# Patient Record
Sex: Female | Born: 1935
Health system: Southern US, Community
[De-identification: ages and names within clinical notes are randomized; demographics above are authoritative.]

## PROBLEM LIST (undated history)

## (undated) DIAGNOSIS — E119 Type 2 diabetes mellitus without complications: Secondary | ICD-10-CM

## (undated) DIAGNOSIS — E782 Mixed hyperlipidemia: Secondary | ICD-10-CM

## (undated) DIAGNOSIS — I10 Essential (primary) hypertension: Secondary | ICD-10-CM

## (undated) DIAGNOSIS — E7800 Pure hypercholesterolemia, unspecified: Secondary | ICD-10-CM

## (undated) DIAGNOSIS — I429 Cardiomyopathy, unspecified: Secondary | ICD-10-CM

## (undated) HISTORY — DX: Type 2 diabetes mellitus without complications: E11.9

## (undated) HISTORY — DX: Pure hypercholesterolemia, unspecified: E78.00

## (undated) HISTORY — PX: OTHER SURGICAL HISTORY: SHX169

## (undated) HISTORY — PX: EYE SURGERY: SHX253

## (undated) HISTORY — DX: Essential (primary) hypertension: I10

---
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
## 2000-12-03 ENCOUNTER — Ambulatory Visit (HOSPITAL_COMMUNITY): Admission: RE | Admit: 2000-12-03 | Discharge: 2000-12-03 | Payer: Self-pay | Admitting: Internal Medicine

## 2000-12-18 ENCOUNTER — Ambulatory Visit (HOSPITAL_COMMUNITY): Admission: RE | Admit: 2000-12-18 | Discharge: 2000-12-18 | Payer: Self-pay | Admitting: Cardiovascular Disease

## 2001-12-23 ENCOUNTER — Ambulatory Visit (HOSPITAL_COMMUNITY): Admission: RE | Admit: 2001-12-23 | Discharge: 2001-12-23 | Payer: Self-pay | Admitting: Family Medicine

## 2001-12-23 ENCOUNTER — Encounter: Payer: Self-pay | Admitting: Family Medicine

## 2002-03-05 ENCOUNTER — Ambulatory Visit (HOSPITAL_COMMUNITY): Admission: RE | Admit: 2002-03-05 | Discharge: 2002-03-05 | Payer: Self-pay | Admitting: Family Medicine

## 2002-03-05 ENCOUNTER — Encounter: Payer: Self-pay | Admitting: Family Medicine

## 2003-05-14 ENCOUNTER — Ambulatory Visit (HOSPITAL_COMMUNITY): Admission: RE | Admit: 2003-05-14 | Discharge: 2003-05-14 | Payer: Self-pay | Admitting: Family Medicine

## 2003-05-14 ENCOUNTER — Encounter: Payer: Self-pay | Admitting: Family Medicine

## 2003-05-26 ENCOUNTER — Encounter (HOSPITAL_COMMUNITY): Admission: RE | Admit: 2003-05-26 | Discharge: 2003-08-24 | Payer: Self-pay | Admitting: Family Medicine

## 2007-10-24 ENCOUNTER — Ambulatory Visit (HOSPITAL_COMMUNITY): Admission: RE | Admit: 2007-10-24 | Discharge: 2007-10-24 | Payer: Self-pay | Admitting: Family Medicine

## 2008-05-01 ENCOUNTER — Ambulatory Visit (HOSPITAL_COMMUNITY): Admission: RE | Admit: 2008-05-01 | Discharge: 2008-05-01 | Payer: Self-pay | Admitting: Family Medicine

## 2010-05-30 ENCOUNTER — Encounter (INDEPENDENT_AMBULATORY_CARE_PROVIDER_SITE_OTHER): Payer: Self-pay

## 2010-08-30 NOTE — Letter (Signed)
Summary: Recall, Screening Colonoscopy Only  [facility]  [address]   [place], [place] [address]   Phone: [phone]  Fax: [phone]    May 30, 2010  Caitlin Reed [address] [place], [place]  [address] 04/06/1936   Dear [name],   Our records indicate it is time to schedule your colonoscopy.    Please call our office at [phone] and ask for the nurse.   Thank you,  [name], LPN [name], LPN  [facility] Ph: [phone]   Fax: [phone]

## 2010-11-15 ENCOUNTER — Other Ambulatory Visit (HOSPITAL_COMMUNITY): Payer: Self-pay | Admitting: Family Medicine

## 2010-11-15 DIAGNOSIS — Z139 Encounter for screening, unspecified: Secondary | ICD-10-CM

## 2010-11-23 ENCOUNTER — Ambulatory Visit (HOSPITAL_COMMUNITY)
Admission: RE | Admit: 2010-11-23 | Discharge: 2010-11-23 | Disposition: A | Discharge status: Home / Self Care | Payer: Medicare Other | Source: Ambulatory Visit | Attending: Family Medicine | Admitting: Family Medicine

## 2010-11-23 DIAGNOSIS — Z139 Encounter for screening, unspecified: Secondary | ICD-10-CM

## 2010-11-23 DIAGNOSIS — Z13820 Encounter for screening for osteoporosis: Secondary | ICD-10-CM | POA: Insufficient documentation

## 2010-12-16 NOTE — Cardiovascular Report (Signed)
[place]  Patient:    Caitlin Reed, Caitlin Reed                  MRN: [id number] Proc. Date: 12/18/00 Adm. Date:  [id number] Disc. Date: [id number] Attending:  Berry, Jonathan [place] CC:         Cardiac Catheterization Laboratory             John Giovanni, M.D., [place], [place]             [facility]             [address] [place], [place]  [address]                        Cardiac Catheterization  INDICATIONS:  [name] is a 75 year old, mildly overweight, married white female with history of hypertension, hyperlipidemia, and type 2 diabetes.  She had a positive GXT and a Cardiolite that showed subtle anterior ischemia.  She presents now for diagnostic coronary arteriography.  PROCEDURE DESCRIPTION:  The patient is brought to the second floor [facility] Cardiac Catheterization Laboratory in the postabsorptive state.  She is premedicated with p.o. Valium.  Her right groin is prepped and shaved in the usual sterile fashion.  One percent Xylocaine was used for local anesthesia.  A 6-French sheath was inserted into the right femoral artery using the standard Seldinger technique.  Six French right and left Judkins diagnostic catheters, along with a 6-French pigtail catheter were used for selective coronary angiography, left ventriculography, and distal abdominal aortography. Omnipaque dye was used for the entirety of the case.  Retrograde aortic, left ventricular, and pullback pressures were recorded.  HEMODYNAMICS: 1. Aortic systolic pressure 163, diastolic pressure 70. 2. Left ventricular systolic pressure 164, end-diastolic pressure 21.  SELECTIVE CORONARY ANGIOGRAPHY: 1. Left main:  Normal. 2. Left anterior descending coronary artery:  Normal. 3. Left circumflex:  Dominant and anomalous:  Normal. 4. Right coronary artery:  Nondominant and normal.  LEFT VENTRICULOGRAPHY:  RAO left ventriculogram was  performed using 25 cc of Omnipaque dye at 12 cc/sec.  The overall LV-EF was estimated at greater than 60%, without focal wall motion abnormalities.  DISTAL ABDOMINAL AORTOGRAPHY:  Distal abdominal aortogram was performed using 20 cc of Omnipaque dye at 20 cc/sec.  The renal arteries were widely patent. The infrarenal abdominal aorta and iliac bifurcation were free of significant atherosclerotic changes.  IMPRESSION:  [name] has normal coronary arteries and normal left ventricular function.  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  She had a false positive Cardiolite and noncardiac chest pain.  DISPOSITION:  The sheaths were removed and pressure was held on the groin to achieve hemostasis.  The patient left the laboratory in stable condition.  She will be discharged later today as an outpatient.  She will see me back in the office in two weeks in follow-up.  Dr. John Giovanni was notified of these results. DD:  12/18/00 TD:  12/18/00 Job: [id number]

## 2013-09-11 ENCOUNTER — Encounter (INDEPENDENT_AMBULATORY_CARE_PROVIDER_SITE_OTHER): Payer: Self-pay | Admitting: *Deleted

## 2013-09-22 ENCOUNTER — Telehealth (INDEPENDENT_AMBULATORY_CARE_PROVIDER_SITE_OTHER): Payer: Self-pay | Admitting: *Deleted

## 2013-09-22 ENCOUNTER — Encounter (INDEPENDENT_AMBULATORY_CARE_PROVIDER_SITE_OTHER): Payer: Self-pay | Admitting: *Deleted

## 2013-09-22 ENCOUNTER — Other Ambulatory Visit (INDEPENDENT_AMBULATORY_CARE_PROVIDER_SITE_OTHER): Payer: Self-pay | Admitting: *Deleted

## 2013-09-22 DIAGNOSIS — Z1211 Encounter for screening for malignant neoplasm of colon: Secondary | ICD-10-CM

## 2013-09-22 MED ORDER — PEG-KCL-NACL-NASULF-NA ASC-C 100 G PO SOLR: 1.0000 | Freq: Once | ORAL | Status: DC

## 2013-09-22 NOTE — Telephone Encounter (Signed)
Patient needs movi prep 

## 2013-10-13 ENCOUNTER — Encounter (INDEPENDENT_AMBULATORY_CARE_PROVIDER_SITE_OTHER): Payer: Self-pay | Admitting: *Deleted

## 2013-10-28 ENCOUNTER — Telehealth (INDEPENDENT_AMBULATORY_CARE_PROVIDER_SITE_OTHER): Payer: Self-pay | Admitting: *Deleted

## 2013-10-28 NOTE — Telephone Encounter (Signed)
  Procedure: tcs  Reason/Indication:  screening  Has patient had this procedure before?  Yes, 12-13 years ago  If so, when, by whom and where?    Is there a family history of colon cancer?  no  Who?  What age when diagnosed?    Is patient diabetic?   yes      Does patient have prosthetic heart valve?  no  Do you have a pacemaker?  no  Has patient ever had endocarditis? no  Has patient had joint replacement within last 12 months?  no  Does patient tend to be constipated or take laxatives? no  Is patient on Coumadin, Plavix and/or Aspirin? yes  Medications: asa 81 mg daily, metformin 1000 mg bid (am & pm), diltiazem 60 mg 1 tab in am 1/2 tab in pm, hctz 25 mg daily, pravastatin 40 mg daily, furosemide 40 mg 1/2 tab daily, preservision bid, mulit vit daily, calcium 1200 mg daily, vit d3 1000 iu daily, prilosec daily  Allergies: nkda  Medication Adjustment: asa 2 days, hold evening dose of metformin day before and morning of  Procedure date & time: 11/26/13

## 2013-10-28 NOTE — Telephone Encounter (Signed)
agree

## 2013-11-12 ENCOUNTER — Other Ambulatory Visit (INDEPENDENT_AMBULATORY_CARE_PROVIDER_SITE_OTHER): Payer: Self-pay | Admitting: *Deleted

## 2013-11-12 ENCOUNTER — Encounter (HOSPITAL_COMMUNITY): Payer: Self-pay | Admitting: Pharmacy Technician

## 2013-11-12 ENCOUNTER — Encounter (INDEPENDENT_AMBULATORY_CARE_PROVIDER_SITE_OTHER): Payer: Self-pay | Admitting: *Deleted

## 2013-11-12 ENCOUNTER — Ambulatory Visit (INDEPENDENT_AMBULATORY_CARE_PROVIDER_SITE_OTHER): Payer: Medicare Other | Admitting: Internal Medicine

## 2013-11-12 ENCOUNTER — Encounter (INDEPENDENT_AMBULATORY_CARE_PROVIDER_SITE_OTHER): Payer: Self-pay | Admitting: Internal Medicine

## 2013-11-12 VITALS — BP 128/52 | HR 80 | Temp 98.0°F | Ht 63.0 in | Wt 175.3 lb

## 2013-11-12 DIAGNOSIS — D509 Iron deficiency anemia, unspecified: Secondary | ICD-10-CM

## 2013-11-12 NOTE — Patient Instructions (Signed)
EGD. The risks and benefits such as perforation, bleeding, and infection were reviewed with the patient and is agreeable. 

## 2013-11-12 NOTE — Progress Notes (Signed)
Subjective:     Patient ID: Caitlin Reed, female   DOB: 11/19/1935, 78 y.o.   MRN: [id number]  HPI Referred to our office by Dr. Karie Kirks for an EGD for Iron deficiency anemia.  Presently scheduled for a colonoscopy the end of this month. 11/05/2013 H and H 11.2 and 34.5, MCV 85.0. Labs in December revealed an H and H 10.2 and 32.2, MCV 84.6. Iron 38, %Sat 9.  Ferritin 7. Her last colonoscopy was in 2002 and was normal.  Appetite is good. No weight loss. No acid reflux which is controlled with Omeprazole. No dysphagia.  There is no abdominal pain. She usually has a BM daily. No melena or bright red rectal bleeding. Stool negative for blood at Dr. Vickey Sages office.   Review of Systems     Past Medical History  Diagnosis Date  . Hypertension   . Diabetes     x 24 yrs  . High cholesterol     Past Surgical History  Procedure Laterality Date  . None      No Known Allergies  Current Outpatient Prescriptions on File Prior to Visit  Medication Sig Dispense Refill  . peg 3350 powder (MOVIPREP) 100 G SOLR Take 1 kit (200 g total) by mouth once.  1 kit  0   No current facility-administered medications on file prior to visit.     Objective:   Physical Exam  Filed Vitals:   11/12/13 1001  BP: 128/52  Pulse: 80  Temp: 98 F (36.7 C)  Height: 5' 3" (1.6 m)  Weight: 175 lb 4.8 oz (79.516 kg)   Alert and oriented. Skin warm and dry. Oral mucosa is moist.   . Sclera anicteric, conjunctivae is pink. Thyroid not enlarged. No cervical lymphadenopathy. Lungs clear. Heart regular rate and rhythm.  Abdomen is soft. Bowel sounds are positive. No hepatomegaly. No abdominal masses felt. No tenderness.  No edema to lower extremities.      Assessment:     Iron deficiency anemia. PUD needs to be ruled out. However no GERD at this time. Stool was negative for blood at Dr. Vickey Sages office. I discussed this case with Dr. Laural Golden    Plan:       EGD with Dr.Rehman.The risks and benefits  such as perforation, bleeding, and infection were reviewed with the patient and is agreeable.

## 2013-11-13 DIAGNOSIS — E7800 Pure hypercholesterolemia, unspecified: Secondary | ICD-10-CM | POA: Insufficient documentation

## 2013-11-13 DIAGNOSIS — D509 Iron deficiency anemia, unspecified: Secondary | ICD-10-CM | POA: Insufficient documentation

## 2013-11-13 DIAGNOSIS — E119 Type 2 diabetes mellitus without complications: Secondary | ICD-10-CM | POA: Insufficient documentation

## 2013-11-14 ENCOUNTER — Ambulatory Visit: Admit: 2013-11-14 | Payer: Self-pay | Admitting: Internal Medicine

## 2013-11-14 SURGERY — EGD (ESOPHAGOGASTRODUODENOSCOPY)
Anesthesia: Moderate Sedation

## 2013-11-26 ENCOUNTER — Encounter (HOSPITAL_COMMUNITY)
Admission: RE | Disposition: A | Discharge status: Home / Self Care | Payer: Self-pay | Source: Ambulatory Visit | Attending: Internal Medicine

## 2013-11-26 ENCOUNTER — Ambulatory Visit (HOSPITAL_COMMUNITY): Admission: RE | Admit: 2013-11-26 | Payer: Medicare Other | Source: Ambulatory Visit | Admitting: Internal Medicine

## 2013-11-26 ENCOUNTER — Encounter (HOSPITAL_COMMUNITY): Admission: RE | Payer: Self-pay | Source: Ambulatory Visit

## 2013-11-26 ENCOUNTER — Ambulatory Visit (HOSPITAL_COMMUNITY)
Admission: RE | Admit: 2013-11-26 | Discharge: 2013-11-26 | Disposition: A | Discharge status: Home / Self Care | Payer: Medicare Other | Source: Ambulatory Visit | Attending: Internal Medicine | Admitting: Internal Medicine

## 2013-11-26 ENCOUNTER — Encounter (HOSPITAL_COMMUNITY): Payer: Self-pay | Admitting: *Deleted

## 2013-11-26 DIAGNOSIS — D509 Iron deficiency anemia, unspecified: Secondary | ICD-10-CM

## 2013-11-26 DIAGNOSIS — E119 Type 2 diabetes mellitus without complications: Secondary | ICD-10-CM | POA: Insufficient documentation

## 2013-11-26 DIAGNOSIS — K644 Residual hemorrhoidal skin tags: Secondary | ICD-10-CM | POA: Insufficient documentation

## 2013-11-26 DIAGNOSIS — Z7982 Long term (current) use of aspirin: Secondary | ICD-10-CM | POA: Insufficient documentation

## 2013-11-26 DIAGNOSIS — K5730 Diverticulosis of large intestine without perforation or abscess without bleeding: Secondary | ICD-10-CM | POA: Insufficient documentation

## 2013-11-26 DIAGNOSIS — K219 Gastro-esophageal reflux disease without esophagitis: Secondary | ICD-10-CM | POA: Insufficient documentation

## 2013-11-26 DIAGNOSIS — D129 Benign neoplasm of anus and anal canal: Principal | ICD-10-CM

## 2013-11-26 DIAGNOSIS — D128 Benign neoplasm of rectum: Secondary | ICD-10-CM | POA: Insufficient documentation

## 2013-11-26 DIAGNOSIS — D126 Benign neoplasm of colon, unspecified: Secondary | ICD-10-CM

## 2013-11-26 DIAGNOSIS — Z79899 Other long term (current) drug therapy: Secondary | ICD-10-CM | POA: Insufficient documentation

## 2013-11-26 DIAGNOSIS — I10 Essential (primary) hypertension: Secondary | ICD-10-CM | POA: Insufficient documentation

## 2013-11-26 HISTORY — PX: ESOPHAGOGASTRODUODENOSCOPY: SHX5428

## 2013-11-26 HISTORY — PX: COLONOSCOPY: SHX5424

## 2013-11-26 LAB — GLUCOSE, CAPILLARY: GLUCOSE-CAPILLARY: 145 mg/dL — AB (ref 70–99)

## 2013-11-26 SURGERY — COLONOSCOPY
Anesthesia: Moderate Sedation

## 2013-11-26 MED ORDER — MIDAZOLAM HCL 5 MG/5ML IJ SOLN
INTRAMUSCULAR | Status: AC
  Filled 2013-11-26: qty 10

## 2013-11-26 MED ORDER — SODIUM CHLORIDE 0.9 % IV SOLN
INTRAVENOUS | Status: DC
  Administered 2013-11-26: 09:00:00 via INTRAVENOUS

## 2013-11-26 MED ORDER — BUTAMBEN-TETRACAINE-BENZOCAINE 2-2-14 % EX AERO
INHALATION_SPRAY | CUTANEOUS | Status: DC | PRN
  Administered 2013-11-26: 2 via TOPICAL

## 2013-11-26 MED ORDER — STERILE WATER FOR IRRIGATION IR SOLN
Status: DC | PRN
  Administered 2013-11-26: 09:00:00

## 2013-11-26 MED ORDER — MIDAZOLAM HCL 5 MG/5ML IJ SOLN
INTRAMUSCULAR | Status: DC | PRN
  Administered 2013-11-26: 2 mg via INTRAVENOUS
  Administered 2013-11-26: 1 mg via INTRAVENOUS
  Administered 2013-11-26 (×2): 2 mg via INTRAVENOUS
  Administered 2013-11-26: 1 mg via INTRAVENOUS

## 2013-11-26 MED ORDER — MEPERIDINE HCL 50 MG/ML IJ SOLN
INTRAMUSCULAR | Status: AC
  Filled 2013-11-26: qty 1

## 2013-11-26 MED ORDER — MEPERIDINE HCL 50 MG/ML IJ SOLN
INTRAMUSCULAR | Status: DC | PRN
  Administered 2013-11-26 (×2): 25 mg via INTRAVENOUS

## 2013-11-26 MED ORDER — FERROUS SULFATE 325 (65 FE) MG PO TABS: 325.0000 mg | ORAL_TABLET | Freq: Every day | ORAL | Status: AC

## 2013-11-26 NOTE — H&P (Signed)
Caitlin Reed is an 78 y.o. female.   Chief Complaint: Patient's here for EGD and colonoscopy. HPI: Patient is 75 old Caucasian female was found to have anemia back in December 2014 and workup revealed iron deficiency. Her hemoglobin then was 10.2. S., to 11.2 with oral iron therapy. She denies melena or rectal bleeding vaginal bleeding or hematuria. Her stool was guaiac negative. She is on PPI for chronic GERD. She is on low dose aspirin but does not take other OTC NSAIDs. Family history is  negative for CRC or celiac disease.  Past Medical History  Diagnosis Date  . Hypertension   . Diabetes     x 24 yrs  . High cholesterol     Past Surgical History  Procedure Laterality Date  . None      History reviewed. No pertinent family history. Social History:  reports that she has never smoked. She does not have any smokeless tobacco history on file. She reports that she does not drink alcohol or use illicit drugs.  Allergies: No Known Allergies  Medications Prior to Admission  Medication Sig Dispense Refill  . aspirin 81 MG tablet Take 81 mg by mouth daily.      . calcium carbonate (OS-CAL) 600 MG TABS tablet Take 1,200 mg by mouth daily.       . cholecalciferol (VITAMIN D) 400 UNITS TABS tablet Take 1,000 Units by mouth.      . diltiazem (CARDIZEM) 60 MG tablet Take 60 mg by mouth 2 (two) times daily.      . furosemide (LASIX) 20 MG tablet Take 20 mg by mouth as needed for fluid.       . hydrochlorothiazide (HYDRODIURIL) 25 MG tablet Take 25 mg by mouth daily.      [name] LORazepam (ATIVAN) 0.5 MG tablet Take 0.5 mg by mouth daily as needed for anxiety.       . metFORMIN (GLUCOPHAGE) 1000 MG tablet Take 1,000 mg by mouth.      . Multiple Vitamins-Minerals (ALIVE WOMENS ENERGY PO) Take by mouth.      . Multiple Vitamins-Minerals (PRESERVISION AREDS 2 PO) Take by mouth.      [name] omeprazole (PRILOSEC) 20 MG capsule Take 20 mg by mouth daily.      . pravastatin (PRAVACHOL) 40 MG tablet Take  40 mg by mouth daily.      . peg 3350 powder (MOVIPREP) 100 G SOLR Take 1 kit (200 g total) by mouth once.  1 kit  0    Results for orders placed during the hospital encounter of 11/26/13 (from the past 48 hour(s))  GLUCOSE, CAPILLARY     Status: Abnormal   Collection Time    11/26/13  8:36 AM      Result Value Ref Range   Glucose-Capillary 145 (*) 70 - 99 mg/dL   No results found.  ROS  Blood pressure 154/67, pulse 79, temperature 97.8 F (36.6 C), temperature source Oral, resp. rate 20, height _0  (1.6 m), weight 171 lb (77.565 kg), SpO2 99.00%. Physical Exam  Constitutional: She appears well-developed and well-nourished.  HENT:  Mouth/Throat: Oropharynx is clear and moist.  Eyes: Conjunctivae are normal. No scleral icterus.  Neck: No thyromegaly present.  Cardiovascular: Normal rate and regular rhythm.   No murmur heard. Respiratory: Effort normal and breath sounds normal.  GI: Soft. She exhibits no distension and no mass. There is no tenderness.  Musculoskeletal: She exhibits no edema.  Lymphadenopathy:    She has no  cervical adenopathy.  Neurological: She is alert.  Skin: Skin is warm and dry.     Assessment/Plan Chronic GERD. Iron deficiency anemia. EGD and Colonoscopy.   Rogene Houston 11/26/2013, 9:31 AM

## 2013-11-26 NOTE — Op Note (Signed)
EGD AND COLONOSCOPY PROCEDURE REPORT  PATIENT:  Caitlin Reed  MR#:  [id number] Birthdate:  26-Dec-1935, 78 y.o., female Endoscopist:  Dr. Rogene Houston, MD Referred By:  Dr. Estill Bamberg. Karie Kirks, MD  Procedure Date: 11/26/2013  Procedure:   EGD & Colonoscopy  Indications:  Patient is 39 old Caucasian female who was recently found to have iron deficiency anemia. Her stool is guaiac-negative. She denies melena or rectal bleeding vaginal bleeding or hematuria. Her last colonoscopy was in 2002. She has chronic GERD and is on PPI. She does not take NSAIDs other than low-dose aspirin. Family history is negative for CRC.            Informed Consent:  The risks, benefits, alternatives & imponderables which include, but are not limited to, bleeding, infection, perforation, drug reaction and potential missed lesion have been reviewed.  The potential for biopsy, lesion removal, esophageal dilation, etc. have also been discussed.  Questions have been answered.  All parties agreeable.  Please see history & physical in medical record for more information.  Medications:  Demerol 50 mg IV Versed 8 mg IV Cetacaine spray topically for oropharyngeal anesthesia  EGD  Description of procedure:  The endoscope was introduced through the mouth and advanced to the second portion of the duodenum without difficulty or limitations. The mucosal surfaces were surveyed very carefully during advancement of the scope and upon withdrawal.  Findings:  Esophagus:  Normal mucosa of the esophagus. GE junction was unremarkable. GEJ:  36 cm Stomach:  Stomach was empty and distended very well with insufflation. Folds in the proximal stomach were normal. Examination of mucosa at gastric body, antrum, pyloric channel, angularis, fundus and cardia was normal. Duodenum:  Normal bulbar and post bulbar mucosa.  Therapeutic/Diagnostic Maneuvers Performed:  None  COLONOSCOPY Description of procedure:  After a digital rectal  exam was performed, that colonoscope was advanced from the anus through the rectum and colon to the area of the cecum, ileocecal valve and appendiceal orifice. The cecum was deeply intubated. These structures were well-seen and photographed for the record. From the level of the cecum and ileocecal valve, the scope was slowly and cautiously withdrawn. The mucosal surfaces were carefully surveyed utilizing scope tip to flexion to facilitate fold flattening as needed. The scope was pulled down into the rectum where a thorough exam including retroflexion was performed.  Findings:   Prep excellent. Few scattered small diverticula at descending and sigmoid colon. 4 mm polyp cold snared from rectosigmoid junction. Small hemorrhoids below the dentate line.  Therapeutic/Diagnostic Maneuvers Performed:  See above  Complications:  None  Cecal Withdrawal Time:  11 minutes  Impression:  Normal esophagogastroduodenoscopy. Few scattered diverticula at the descending and sigmoid colon. Small polyp cold snare from rectosigmoid junction. Small external hemorrhoids.  Comment; Iron deficiency anemia possibly secondary to impaired iron absorption due to chronic PPI therapy.  Recommendations:  Standard instructions given. Resume ferrous sulfate 325 mg by mouth daily. I will contact patient with biopsy results. She will have H&H by Dr. Karie Kirks in one month.  Rogene Houston  11/26/2013 10:23 AM  CC: Dr. Robert Bellow, MD & Dr. Rayne Du ref. provider found

## 2013-11-26 NOTE — Discharge Instructions (Signed)
Resume usual medications and diet. Ferrous sulfate 325 mg by mouth daily with breakfast. No driving for 24 hours. Physician will call with biopsy results. Hemoglobin and hematocrit by Dr. Karie Kirks in one month.    Colon Polyps Polyps are lumps of extra tissue growing inside the body. Polyps can grow in the large intestine (colon). Most colon polyps are noncancerous (benign). However, some colon polyps can become cancerous over time. Polyps that are larger than a pea may be harmful. To be safe, caregivers remove and test all polyps. CAUSES  Polyps form when mutations in the genes cause your cells to grow and divide even though no more tissue is needed. RISK FACTORS There are a number of risk factors that can increase your chances of getting colon polyps. They include:  Being older than 50 years.  Family history of colon polyps or colon cancer.  Long-term colon diseases, such as colitis or Crohn disease.  Being overweight.  Smoking.  Being inactive.  Drinking too much alcohol. SYMPTOMS  Most small polyps do not cause symptoms. If symptoms are present, they may include:  Blood in the stool. The stool may look dark red or black.  Constipation or diarrhea that lasts longer than 1 week. DIAGNOSIS People often do not know they have polyps until their caregiver finds them during a regular checkup. Your caregiver can use 4 tests to check for polyps:  Digital rectal exam. The caregiver wears gloves and feels inside the rectum. This test would find polyps only in the rectum.  Barium enema. The caregiver puts a liquid called barium into your rectum before taking X-rays of your colon. Barium makes your colon look white. Polyps are dark, so they are easy to see in the X-ray pictures.  Sigmoidoscopy. A thin, flexible tube (sigmoidoscope) is placed into your rectum. The sigmoidoscope has a light and tiny camera in it. The caregiver uses the sigmoidoscope to look at the last third of your  colon.  Colonoscopy. This test is like sigmoidoscopy, but the caregiver looks at the entire colon. This is the most common method for finding and removing polyps. TREATMENT  Any polyps will be removed during a sigmoidoscopy or colonoscopy. The polyps are then tested for cancer. PREVENTION  To help lower your risk of getting more colon polyps:  Eat plenty of fruits and vegetables. Avoid eating fatty foods.  Do not smoke.  Avoid drinking alcohol.  Exercise every day.  Lose weight if recommended by your caregiver.  Eat plenty of calcium and folate. Foods that are rich in calcium include milk, cheese, and broccoli. Foods that are rich in folate include chickpeas, kidney beans, and spinach. HOME CARE INSTRUCTIONS Keep all follow-up appointments as directed by your caregiver. You may need periodic exams to check for polyps. SEEK MEDICAL CARE IF: You notice bleeding during a bowel movement. Document Released: 04/12/2004 Document Revised: 10/09/2011 Document Reviewed: 09/26/2011 [facility] Patient Information 2014 [place]. Esophagogastroduodenoscopy Esophagogastroduodenoscopy (EGD) is a procedure to examine the lining of the esophagus, stomach, and first part of the small intestine (duodenum). A long, flexible, lighted tube with a camera attached (endoscope) is inserted down the throat to view these organs. This procedure is done to detect problems or abnormalities, such as inflammation, bleeding, ulcers, or growths, in order to treat them. The procedure lasts about 5 20 minutes. It is usually an outpatient procedure, but it may need to be performed in emergency cases in the hospital. LET YOUR CAREGIVER KNOW ABOUT:   Allergies to food or  medicine.  All medicines you are taking, including vitamins, herbs, eyedrops, and over-the-counter medicines and creams.  Use of steroids (by mouth or creams).  Previous problems you or members of your family have had with the use of  anesthetics.  Any blood disorders you have.  Previous surgeries you have had.  Other health problems you have.  Possibility of pregnancy, if this applies. RISKS AND COMPLICATIONS  Generally, EGD is a safe procedure. However, as with any procedure, complications can occur. Possible complications include:  Infection.  Bleeding.  Tearing (perforation) of the esophagus, stomach, or duodenum.  Difficulty breathing or not being able to breath.  Excessive sweating.  Spasms of the larynx.  Slowed heartbeat.  Low blood pressure. BEFORE THE PROCEDURE  Do not eat or drink anything for 6 8 hours before the procedure or as directed by your caregiver.  Ask your caregiver about changing or stopping your regular medicines.  If you wear dentures, be prepared to remove them before the procedure.  Arrange for someone to drive you home after the procedure. PROCEDURE   A vein will be accessed to give medicines and fluids. A medicine to relax you (sedative) and a pain reliever will be given through that access into the vein.  A numbing medicine (local anesthetic) may be sprayed on your throat for comfort and to stop you from gagging or coughing.  A mouth guard may be placed in your mouth to protect your teeth and to keep you from biting on the endoscope.  You will be asked to lie on your left side.  The endoscope is inserted down your throat and into the esophagus, stomach, and duodenum.  Air is put through the endoscope to allow your caregiver to view the lining of your esophagus clearly.  The esophagus, stomach, and duodenum is then examined. During the exam, your caregiver may:  Remove tissue to be examined under a microscope (biopsy) for inflammation, infection, or other medical problems.  Remove growths.  Remove objects (foreign bodies) that are stuck.  Treat any bleeding with medicines or other devices that stop tissues from bleeding (hot cauters, clipping devices).  Widen  (dilate) or stretch narrowed areas of the esophagus and stomach.  The endoscope will then be withdrawn. AFTER THE PROCEDURE  You will be taken to a recovery area to be monitored. You will be able to go home once you are stable and alert.  Do not eat or drink anything until the local anesthetic and numbing medicines have worn off. You may choke.  It is normal to feel bloated, have pain with swallowing, or have a sore throat for a short time. This will wear off.  Your caregiver should be able to discuss his or her findings with you. It will take longer to discuss the test results if any biopsies were taken. Document Released: 11/17/2004 Document Revised: 07/03/2012 Document Reviewed: 06/19/2012 [facility] Patient Information 2014 [place], [place]. Colonoscopy Care After These instructions give you information on caring for yourself after your procedure. Your doctor may also give you more specific instructions. Call your doctor if you have any problems or questions after your procedure. HOME CARE  Take it easy for the next 24 hours.  Rest.  Walk or use warm packs on your belly (abdomen) if you have belly cramping or gas.  Do not drive for 24 hours.  You may shower.  Do not sign important papers or use machinery for 24 hours.  Drink enough fluids to keep your pee (urine) clear  or pale yellow.  Resume your normal diet. Avoid heavy or fried foods.  Avoid alcohol.  Continue taking your normal medicines.  Only take medicine as told by your doctor. Do not take aspirin. If you had growths (polyps) removed:  Do not take aspirin.  Do not drink alcohol for 7 days or as told by your doctor.  Eat a soft diet for 24 hours. GET HELP RIGHT AWAY IF:  You have a fever.  You pass clumps of tissue (blood clots) or fill the toilet with blood.  You have belly pain that gets worse and medicine does not help.  Your belly is puffy (swollen).  You feel sick to your stomach (nauseous) or  throw up (vomit). MAKE SURE YOU:  Understand these instructions.  Will watch your condition.  Will get help right away if you are not doing well or get worse. Document Released: 08/19/2010 Document Revised: 10/09/2011 Document Reviewed: 03/24/2013 [facility] Patient Information 2014 [place].

## 2013-11-28 ENCOUNTER — Encounter (HOSPITAL_COMMUNITY): Payer: Self-pay | Admitting: Internal Medicine

## 2013-12-02 ENCOUNTER — Encounter (INDEPENDENT_AMBULATORY_CARE_PROVIDER_SITE_OTHER): Payer: Self-pay | Admitting: *Deleted

## 2014-01-21 ENCOUNTER — Encounter (INDEPENDENT_AMBULATORY_CARE_PROVIDER_SITE_OTHER): Payer: Self-pay

## 2014-12-10 ENCOUNTER — Ambulatory Visit (HOSPITAL_COMMUNITY)
Admission: RE | Admit: 2014-12-10 | Discharge: 2014-12-10 | Disposition: A | Discharge status: Home / Self Care | Payer: Medicare Other | Source: Ambulatory Visit | Attending: Preventative Medicine | Admitting: Preventative Medicine

## 2014-12-10 ENCOUNTER — Other Ambulatory Visit (HOSPITAL_COMMUNITY): Payer: Self-pay | Admitting: Preventative Medicine

## 2014-12-10 DIAGNOSIS — R609 Edema, unspecified: Secondary | ICD-10-CM

## 2015-02-09 ENCOUNTER — Other Ambulatory Visit (HOSPITAL_COMMUNITY): Payer: Self-pay | Admitting: Family Medicine

## 2015-02-09 DIAGNOSIS — M810 Age-related osteoporosis without current pathological fracture: Secondary | ICD-10-CM

## 2015-02-16 ENCOUNTER — Ambulatory Visit (HOSPITAL_COMMUNITY)
Admission: RE | Admit: 2015-02-16 | Discharge: 2015-02-16 | Disposition: A | Discharge status: Home / Self Care | Payer: Medicare Other | Source: Ambulatory Visit | Attending: Family Medicine | Admitting: Family Medicine

## 2015-02-16 DIAGNOSIS — M810 Age-related osteoporosis without current pathological fracture: Secondary | ICD-10-CM

## 2015-02-16 DIAGNOSIS — Z780 Asymptomatic menopausal state: Secondary | ICD-10-CM | POA: Insufficient documentation

## 2015-02-16 DIAGNOSIS — M8580 Other specified disorders of bone density and structure, unspecified site: Secondary | ICD-10-CM | POA: Insufficient documentation

## 2015-08-09 DIAGNOSIS — H2512 Age-related nuclear cataract, left eye: Secondary | ICD-10-CM | POA: Diagnosis not present

## 2015-08-09 DIAGNOSIS — H25812 Combined forms of age-related cataract, left eye: Secondary | ICD-10-CM | POA: Diagnosis not present

## 2015-08-18 DIAGNOSIS — I10 Essential (primary) hypertension: Secondary | ICD-10-CM | POA: Diagnosis not present

## 2015-08-18 DIAGNOSIS — E6609 Other obesity due to excess calories: Secondary | ICD-10-CM | POA: Diagnosis not present

## 2015-08-18 DIAGNOSIS — E1122 Type 2 diabetes mellitus with diabetic chronic kidney disease: Secondary | ICD-10-CM | POA: Diagnosis not present

## 2015-08-18 DIAGNOSIS — E1165 Type 2 diabetes mellitus with hyperglycemia: Secondary | ICD-10-CM | POA: Diagnosis not present

## 2015-08-19 DIAGNOSIS — E1165 Type 2 diabetes mellitus with hyperglycemia: Secondary | ICD-10-CM | POA: Diagnosis not present

## 2015-08-19 DIAGNOSIS — I10 Essential (primary) hypertension: Secondary | ICD-10-CM | POA: Diagnosis not present

## 2015-08-19 DIAGNOSIS — N183 Chronic kidney disease, stage 3 (moderate): Secondary | ICD-10-CM | POA: Diagnosis not present

## 2015-08-31 DIAGNOSIS — H2511 Age-related nuclear cataract, right eye: Secondary | ICD-10-CM | POA: Diagnosis not present

## 2015-09-03 DIAGNOSIS — L851 Acquired keratosis [keratoderma] palmaris et plantaris: Secondary | ICD-10-CM | POA: Diagnosis not present

## 2015-09-03 DIAGNOSIS — L97521 Non-pressure chronic ulcer of other part of left foot limited to breakdown of skin: Secondary | ICD-10-CM | POA: Diagnosis not present

## 2015-09-03 DIAGNOSIS — L97511 Non-pressure chronic ulcer of other part of right foot limited to breakdown of skin: Secondary | ICD-10-CM | POA: Diagnosis not present

## 2015-09-03 DIAGNOSIS — E1142 Type 2 diabetes mellitus with diabetic polyneuropathy: Secondary | ICD-10-CM | POA: Diagnosis not present

## 2015-09-03 DIAGNOSIS — B351 Tinea unguium: Secondary | ICD-10-CM | POA: Diagnosis not present

## 2015-09-06 DIAGNOSIS — H2511 Age-related nuclear cataract, right eye: Secondary | ICD-10-CM | POA: Diagnosis not present

## 2015-09-17 DIAGNOSIS — E1142 Type 2 diabetes mellitus with diabetic polyneuropathy: Secondary | ICD-10-CM | POA: Diagnosis not present

## 2015-09-17 DIAGNOSIS — L97529 Non-pressure chronic ulcer of other part of left foot with unspecified severity: Secondary | ICD-10-CM | POA: Diagnosis not present

## 2015-09-28 DIAGNOSIS — E119 Type 2 diabetes mellitus without complications: Secondary | ICD-10-CM | POA: Diagnosis not present

## 2015-09-28 DIAGNOSIS — E663 Overweight: Secondary | ICD-10-CM | POA: Diagnosis not present

## 2015-09-28 DIAGNOSIS — N183 Chronic kidney disease, stage 3 (moderate): Secondary | ICD-10-CM | POA: Diagnosis not present

## 2015-10-01 DIAGNOSIS — N183 Chronic kidney disease, stage 3 (moderate): Secondary | ICD-10-CM | POA: Diagnosis not present

## 2015-10-01 DIAGNOSIS — I10 Essential (primary) hypertension: Secondary | ICD-10-CM | POA: Diagnosis not present

## 2015-10-01 DIAGNOSIS — E119 Type 2 diabetes mellitus without complications: Secondary | ICD-10-CM | POA: Diagnosis not present

## 2015-10-01 DIAGNOSIS — E1142 Type 2 diabetes mellitus with diabetic polyneuropathy: Secondary | ICD-10-CM | POA: Diagnosis not present

## 2015-10-05 DIAGNOSIS — L97511 Non-pressure chronic ulcer of other part of right foot limited to breakdown of skin: Secondary | ICD-10-CM | POA: Diagnosis not present

## 2015-10-05 DIAGNOSIS — E1142 Type 2 diabetes mellitus with diabetic polyneuropathy: Secondary | ICD-10-CM | POA: Diagnosis not present

## 2015-10-06 DIAGNOSIS — M1711 Unilateral primary osteoarthritis, right knee: Secondary | ICD-10-CM | POA: Diagnosis not present

## 2015-10-19 DIAGNOSIS — N183 Chronic kidney disease, stage 3 (moderate): Secondary | ICD-10-CM | POA: Diagnosis not present

## 2015-10-19 DIAGNOSIS — E1165 Type 2 diabetes mellitus with hyperglycemia: Secondary | ICD-10-CM | POA: Diagnosis not present

## 2015-10-19 DIAGNOSIS — E119 Type 2 diabetes mellitus without complications: Secondary | ICD-10-CM | POA: Diagnosis not present

## 2015-10-26 DIAGNOSIS — E1142 Type 2 diabetes mellitus with diabetic polyneuropathy: Secondary | ICD-10-CM | POA: Diagnosis not present

## 2015-10-26 DIAGNOSIS — L97511 Non-pressure chronic ulcer of other part of right foot limited to breakdown of skin: Secondary | ICD-10-CM | POA: Diagnosis not present

## 2015-11-09 DIAGNOSIS — L97511 Non-pressure chronic ulcer of other part of right foot limited to breakdown of skin: Secondary | ICD-10-CM | POA: Diagnosis not present

## 2015-11-09 DIAGNOSIS — E1142 Type 2 diabetes mellitus with diabetic polyneuropathy: Secondary | ICD-10-CM | POA: Diagnosis not present

## 2015-11-23 DIAGNOSIS — E1142 Type 2 diabetes mellitus with diabetic polyneuropathy: Secondary | ICD-10-CM | POA: Diagnosis not present

## 2015-11-23 DIAGNOSIS — L97511 Non-pressure chronic ulcer of other part of right foot limited to breakdown of skin: Secondary | ICD-10-CM | POA: Diagnosis not present

## 2015-12-01 DIAGNOSIS — M1711 Unilateral primary osteoarthritis, right knee: Secondary | ICD-10-CM | POA: Diagnosis not present

## 2015-12-07 DIAGNOSIS — E1142 Type 2 diabetes mellitus with diabetic polyneuropathy: Secondary | ICD-10-CM | POA: Diagnosis not present

## 2015-12-07 DIAGNOSIS — L97511 Non-pressure chronic ulcer of other part of right foot limited to breakdown of skin: Secondary | ICD-10-CM | POA: Diagnosis not present

## 2015-12-10 DIAGNOSIS — E663 Overweight: Secondary | ICD-10-CM | POA: Diagnosis not present

## 2015-12-10 DIAGNOSIS — F419 Anxiety disorder, unspecified: Secondary | ICD-10-CM | POA: Diagnosis not present

## 2015-12-10 DIAGNOSIS — E1165 Type 2 diabetes mellitus with hyperglycemia: Secondary | ICD-10-CM | POA: Diagnosis not present

## 2015-12-10 DIAGNOSIS — I10 Essential (primary) hypertension: Secondary | ICD-10-CM | POA: Diagnosis not present

## 2015-12-16 DIAGNOSIS — E119 Type 2 diabetes mellitus without complications: Secondary | ICD-10-CM | POA: Diagnosis not present

## 2015-12-16 DIAGNOSIS — E1165 Type 2 diabetes mellitus with hyperglycemia: Secondary | ICD-10-CM | POA: Diagnosis not present

## 2015-12-16 DIAGNOSIS — R799 Abnormal finding of blood chemistry, unspecified: Secondary | ICD-10-CM | POA: Diagnosis not present

## 2015-12-22 DIAGNOSIS — M1711 Unilateral primary osteoarthritis, right knee: Secondary | ICD-10-CM | POA: Diagnosis not present

## 2015-12-24 DIAGNOSIS — L97511 Non-pressure chronic ulcer of other part of right foot limited to breakdown of skin: Secondary | ICD-10-CM | POA: Diagnosis not present

## 2015-12-24 DIAGNOSIS — E1142 Type 2 diabetes mellitus with diabetic polyneuropathy: Secondary | ICD-10-CM | POA: Diagnosis not present

## 2015-12-29 DIAGNOSIS — M1711 Unilateral primary osteoarthritis, right knee: Secondary | ICD-10-CM | POA: Diagnosis not present

## 2016-01-05 DIAGNOSIS — M1711 Unilateral primary osteoarthritis, right knee: Secondary | ICD-10-CM | POA: Diagnosis not present

## 2016-01-11 DIAGNOSIS — L601 Onycholysis: Secondary | ICD-10-CM | POA: Diagnosis not present

## 2016-01-11 DIAGNOSIS — E1142 Type 2 diabetes mellitus with diabetic polyneuropathy: Secondary | ICD-10-CM | POA: Diagnosis not present

## 2016-01-11 DIAGNOSIS — L97511 Non-pressure chronic ulcer of other part of right foot limited to breakdown of skin: Secondary | ICD-10-CM | POA: Diagnosis not present

## 2016-01-21 DIAGNOSIS — E1142 Type 2 diabetes mellitus with diabetic polyneuropathy: Secondary | ICD-10-CM | POA: Diagnosis not present

## 2016-01-21 DIAGNOSIS — L97511 Non-pressure chronic ulcer of other part of right foot limited to breakdown of skin: Secondary | ICD-10-CM | POA: Diagnosis not present

## 2016-02-04 DIAGNOSIS — E1142 Type 2 diabetes mellitus with diabetic polyneuropathy: Secondary | ICD-10-CM | POA: Diagnosis not present

## 2016-02-04 DIAGNOSIS — L97511 Non-pressure chronic ulcer of other part of right foot limited to breakdown of skin: Secondary | ICD-10-CM | POA: Diagnosis not present

## 2016-02-25 DIAGNOSIS — E1142 Type 2 diabetes mellitus with diabetic polyneuropathy: Secondary | ICD-10-CM | POA: Diagnosis not present

## 2016-02-25 DIAGNOSIS — L03032 Cellulitis of left toe: Secondary | ICD-10-CM | POA: Diagnosis not present

## 2016-02-25 DIAGNOSIS — S81812A Laceration without foreign body, left lower leg, initial encounter: Secondary | ICD-10-CM | POA: Diagnosis not present

## 2016-02-25 DIAGNOSIS — L97521 Non-pressure chronic ulcer of other part of left foot limited to breakdown of skin: Secondary | ICD-10-CM | POA: Diagnosis not present

## 2016-02-25 DIAGNOSIS — L03116 Cellulitis of left lower limb: Secondary | ICD-10-CM | POA: Diagnosis not present

## 2016-03-10 DIAGNOSIS — L97521 Non-pressure chronic ulcer of other part of left foot limited to breakdown of skin: Secondary | ICD-10-CM | POA: Diagnosis not present

## 2016-03-10 DIAGNOSIS — S81812D Laceration without foreign body, left lower leg, subsequent encounter: Secondary | ICD-10-CM | POA: Diagnosis not present

## 2016-03-10 DIAGNOSIS — E1142 Type 2 diabetes mellitus with diabetic polyneuropathy: Secondary | ICD-10-CM | POA: Diagnosis not present

## 2016-03-13 DIAGNOSIS — E6609 Other obesity due to excess calories: Secondary | ICD-10-CM | POA: Diagnosis not present

## 2016-03-13 DIAGNOSIS — E1122 Type 2 diabetes mellitus with diabetic chronic kidney disease: Secondary | ICD-10-CM | POA: Diagnosis not present

## 2016-03-13 DIAGNOSIS — N183 Chronic kidney disease, stage 3 (moderate): Secondary | ICD-10-CM | POA: Diagnosis not present

## 2016-03-13 DIAGNOSIS — E1165 Type 2 diabetes mellitus with hyperglycemia: Secondary | ICD-10-CM | POA: Diagnosis not present

## 2016-03-15 DIAGNOSIS — I10 Essential (primary) hypertension: Secondary | ICD-10-CM | POA: Diagnosis not present

## 2016-03-15 DIAGNOSIS — E6609 Other obesity due to excess calories: Secondary | ICD-10-CM | POA: Diagnosis not present

## 2016-03-15 DIAGNOSIS — M8580 Other specified disorders of bone density and structure, unspecified site: Secondary | ICD-10-CM | POA: Diagnosis not present

## 2016-03-15 DIAGNOSIS — E782 Mixed hyperlipidemia: Secondary | ICD-10-CM | POA: Diagnosis not present

## 2016-03-17 DIAGNOSIS — E1142 Type 2 diabetes mellitus with diabetic polyneuropathy: Secondary | ICD-10-CM | POA: Diagnosis not present

## 2016-03-17 DIAGNOSIS — S81802A Unspecified open wound, left lower leg, initial encounter: Secondary | ICD-10-CM | POA: Diagnosis not present

## 2016-04-07 DIAGNOSIS — E1165 Type 2 diabetes mellitus with hyperglycemia: Secondary | ICD-10-CM | POA: Diagnosis not present

## 2016-04-07 DIAGNOSIS — R799 Abnormal finding of blood chemistry, unspecified: Secondary | ICD-10-CM | POA: Diagnosis not present

## 2016-04-07 DIAGNOSIS — E119 Type 2 diabetes mellitus without complications: Secondary | ICD-10-CM | POA: Diagnosis not present

## 2016-04-07 DIAGNOSIS — E1142 Type 2 diabetes mellitus with diabetic polyneuropathy: Secondary | ICD-10-CM | POA: Diagnosis not present

## 2016-04-07 DIAGNOSIS — S81802A Unspecified open wound, left lower leg, initial encounter: Secondary | ICD-10-CM | POA: Diagnosis not present

## 2016-04-11 DIAGNOSIS — E1122 Type 2 diabetes mellitus with diabetic chronic kidney disease: Secondary | ICD-10-CM | POA: Diagnosis not present

## 2016-04-11 DIAGNOSIS — I10 Essential (primary) hypertension: Secondary | ICD-10-CM | POA: Diagnosis not present

## 2016-04-11 DIAGNOSIS — E782 Mixed hyperlipidemia: Secondary | ICD-10-CM | POA: Diagnosis not present

## 2016-04-11 DIAGNOSIS — E1165 Type 2 diabetes mellitus with hyperglycemia: Secondary | ICD-10-CM | POA: Diagnosis not present

## 2016-04-11 DIAGNOSIS — N183 Chronic kidney disease, stage 3 (moderate): Secondary | ICD-10-CM | POA: Diagnosis not present

## 2016-04-19 DIAGNOSIS — M1711 Unilateral primary osteoarthritis, right knee: Secondary | ICD-10-CM | POA: Diagnosis not present

## 2016-05-11 DIAGNOSIS — E119 Type 2 diabetes mellitus without complications: Secondary | ICD-10-CM | POA: Diagnosis not present

## 2016-05-11 DIAGNOSIS — R799 Abnormal finding of blood chemistry, unspecified: Secondary | ICD-10-CM | POA: Diagnosis not present

## 2016-05-11 DIAGNOSIS — E1165 Type 2 diabetes mellitus with hyperglycemia: Secondary | ICD-10-CM | POA: Diagnosis not present

## 2016-05-19 DIAGNOSIS — E1142 Type 2 diabetes mellitus with diabetic polyneuropathy: Secondary | ICD-10-CM | POA: Diagnosis not present

## 2016-05-19 DIAGNOSIS — L851 Acquired keratosis [keratoderma] palmaris et plantaris: Secondary | ICD-10-CM | POA: Diagnosis not present

## 2016-05-19 DIAGNOSIS — B351 Tinea unguium: Secondary | ICD-10-CM | POA: Diagnosis not present

## 2016-06-07 DIAGNOSIS — E559 Vitamin D deficiency, unspecified: Secondary | ICD-10-CM | POA: Diagnosis not present

## 2016-06-07 DIAGNOSIS — I129 Hypertensive chronic kidney disease with stage 1 through stage 4 chronic kidney disease, or unspecified chronic kidney disease: Secondary | ICD-10-CM | POA: Diagnosis not present

## 2016-06-07 DIAGNOSIS — Z23 Encounter for immunization: Secondary | ICD-10-CM | POA: Diagnosis not present

## 2016-06-07 DIAGNOSIS — N183 Chronic kidney disease, stage 3 (moderate): Secondary | ICD-10-CM | POA: Diagnosis not present

## 2016-06-07 DIAGNOSIS — R8271 Bacteriuria: Secondary | ICD-10-CM | POA: Diagnosis not present

## 2016-06-07 DIAGNOSIS — E1129 Type 2 diabetes mellitus with other diabetic kidney complication: Secondary | ICD-10-CM | POA: Diagnosis not present

## 2016-06-08 ENCOUNTER — Other Ambulatory Visit: Payer: Self-pay | Admitting: Nephrology

## 2016-06-08 DIAGNOSIS — N183 Chronic kidney disease, stage 3 unspecified: Secondary | ICD-10-CM

## 2016-06-09 DIAGNOSIS — N183 Chronic kidney disease, stage 3 (moderate): Secondary | ICD-10-CM | POA: Diagnosis not present

## 2016-06-09 DIAGNOSIS — R8271 Bacteriuria: Secondary | ICD-10-CM | POA: Diagnosis not present

## 2016-06-16 ENCOUNTER — Other Ambulatory Visit: Payer: Medicare Other

## 2016-06-20 DIAGNOSIS — D509 Iron deficiency anemia, unspecified: Secondary | ICD-10-CM | POA: Diagnosis not present

## 2016-06-20 DIAGNOSIS — E538 Deficiency of other specified B group vitamins: Secondary | ICD-10-CM | POA: Diagnosis not present

## 2016-06-20 DIAGNOSIS — E1165 Type 2 diabetes mellitus with hyperglycemia: Secondary | ICD-10-CM | POA: Diagnosis not present

## 2016-06-20 DIAGNOSIS — N183 Chronic kidney disease, stage 3 (moderate): Secondary | ICD-10-CM | POA: Diagnosis not present

## 2016-06-21 DIAGNOSIS — E538 Deficiency of other specified B group vitamins: Secondary | ICD-10-CM | POA: Diagnosis not present

## 2016-06-21 DIAGNOSIS — D509 Iron deficiency anemia, unspecified: Secondary | ICD-10-CM | POA: Diagnosis not present

## 2016-06-21 DIAGNOSIS — N183 Chronic kidney disease, stage 3 (moderate): Secondary | ICD-10-CM | POA: Diagnosis not present

## 2016-06-21 DIAGNOSIS — E1165 Type 2 diabetes mellitus with hyperglycemia: Secondary | ICD-10-CM | POA: Diagnosis not present

## 2016-06-28 DIAGNOSIS — E538 Deficiency of other specified B group vitamins: Secondary | ICD-10-CM | POA: Diagnosis not present

## 2016-06-28 DIAGNOSIS — D509 Iron deficiency anemia, unspecified: Secondary | ICD-10-CM | POA: Diagnosis not present

## 2016-06-29 DIAGNOSIS — H02834 Dermatochalasis of left upper eyelid: Secondary | ICD-10-CM | POA: Diagnosis not present

## 2016-06-29 DIAGNOSIS — H02831 Dermatochalasis of right upper eyelid: Secondary | ICD-10-CM | POA: Diagnosis not present

## 2016-06-29 DIAGNOSIS — H40013 Open angle with borderline findings, low risk, bilateral: Secondary | ICD-10-CM | POA: Diagnosis not present

## 2016-06-29 DIAGNOSIS — E119 Type 2 diabetes mellitus without complications: Secondary | ICD-10-CM | POA: Diagnosis not present

## 2016-06-29 DIAGNOSIS — H353131 Nonexudative age-related macular degeneration, bilateral, early dry stage: Secondary | ICD-10-CM | POA: Diagnosis not present

## 2016-06-29 DIAGNOSIS — Z961 Presence of intraocular lens: Secondary | ICD-10-CM | POA: Diagnosis not present

## 2016-06-30 ENCOUNTER — Ambulatory Visit
Admission: RE | Admit: 2016-06-30 | Discharge: 2016-06-30 | Disposition: A | Discharge status: Home / Self Care | Payer: Medicare Other | Source: Ambulatory Visit | Attending: Nephrology | Admitting: Nephrology

## 2016-06-30 DIAGNOSIS — N183 Chronic kidney disease, stage 3 unspecified: Secondary | ICD-10-CM

## 2016-06-30 DIAGNOSIS — N189 Chronic kidney disease, unspecified: Secondary | ICD-10-CM | POA: Diagnosis not present

## 2016-07-28 DIAGNOSIS — B351 Tinea unguium: Secondary | ICD-10-CM | POA: Diagnosis not present

## 2016-07-28 DIAGNOSIS — E1142 Type 2 diabetes mellitus with diabetic polyneuropathy: Secondary | ICD-10-CM | POA: Diagnosis not present

## 2016-07-28 DIAGNOSIS — L851 Acquired keratosis [keratoderma] palmaris et plantaris: Secondary | ICD-10-CM | POA: Diagnosis not present

## 2016-09-19 DIAGNOSIS — E538 Deficiency of other specified B group vitamins: Secondary | ICD-10-CM | POA: Diagnosis not present

## 2016-09-19 DIAGNOSIS — E1165 Type 2 diabetes mellitus with hyperglycemia: Secondary | ICD-10-CM | POA: Diagnosis not present

## 2016-09-19 DIAGNOSIS — F4321 Adjustment disorder with depressed mood: Secondary | ICD-10-CM | POA: Diagnosis not present

## 2016-09-19 DIAGNOSIS — E1122 Type 2 diabetes mellitus with diabetic chronic kidney disease: Secondary | ICD-10-CM | POA: Diagnosis not present

## 2016-09-21 DIAGNOSIS — D509 Iron deficiency anemia, unspecified: Secondary | ICD-10-CM | POA: Diagnosis not present

## 2016-09-21 DIAGNOSIS — E1122 Type 2 diabetes mellitus with diabetic chronic kidney disease: Secondary | ICD-10-CM | POA: Diagnosis not present

## 2016-09-21 DIAGNOSIS — E1165 Type 2 diabetes mellitus with hyperglycemia: Secondary | ICD-10-CM | POA: Diagnosis not present

## 2016-09-21 DIAGNOSIS — D649 Anemia, unspecified: Secondary | ICD-10-CM | POA: Diagnosis not present

## 2016-10-04 DIAGNOSIS — N183 Chronic kidney disease, stage 3 (moderate): Secondary | ICD-10-CM | POA: Diagnosis not present

## 2016-10-04 DIAGNOSIS — E1129 Type 2 diabetes mellitus with other diabetic kidney complication: Secondary | ICD-10-CM | POA: Diagnosis not present

## 2016-10-04 DIAGNOSIS — Z6828 Body mass index (BMI) 28.0-28.9, adult: Secondary | ICD-10-CM | POA: Diagnosis not present

## 2016-10-04 DIAGNOSIS — I129 Hypertensive chronic kidney disease with stage 1 through stage 4 chronic kidney disease, or unspecified chronic kidney disease: Secondary | ICD-10-CM | POA: Diagnosis not present

## 2016-10-20 DIAGNOSIS — E1142 Type 2 diabetes mellitus with diabetic polyneuropathy: Secondary | ICD-10-CM | POA: Diagnosis not present

## 2016-10-20 DIAGNOSIS — L851 Acquired keratosis [keratoderma] palmaris et plantaris: Secondary | ICD-10-CM | POA: Diagnosis not present

## 2016-10-20 DIAGNOSIS — B351 Tinea unguium: Secondary | ICD-10-CM | POA: Diagnosis not present

## 2016-12-14 DIAGNOSIS — E1129 Type 2 diabetes mellitus with other diabetic kidney complication: Secondary | ICD-10-CM | POA: Diagnosis not present

## 2016-12-14 DIAGNOSIS — Z6829 Body mass index (BMI) 29.0-29.9, adult: Secondary | ICD-10-CM | POA: Diagnosis not present

## 2016-12-14 DIAGNOSIS — N183 Chronic kidney disease, stage 3 (moderate): Secondary | ICD-10-CM | POA: Diagnosis not present

## 2016-12-14 DIAGNOSIS — I129 Hypertensive chronic kidney disease with stage 1 through stage 4 chronic kidney disease, or unspecified chronic kidney disease: Secondary | ICD-10-CM | POA: Diagnosis not present

## 2016-12-19 DIAGNOSIS — F419 Anxiety disorder, unspecified: Secondary | ICD-10-CM | POA: Diagnosis not present

## 2016-12-19 DIAGNOSIS — E1122 Type 2 diabetes mellitus with diabetic chronic kidney disease: Secondary | ICD-10-CM | POA: Diagnosis not present

## 2016-12-19 DIAGNOSIS — K210 Gastro-esophageal reflux disease with esophagitis: Secondary | ICD-10-CM | POA: Diagnosis not present

## 2016-12-19 DIAGNOSIS — I10 Essential (primary) hypertension: Secondary | ICD-10-CM | POA: Diagnosis not present

## 2016-12-20 DIAGNOSIS — D509 Iron deficiency anemia, unspecified: Secondary | ICD-10-CM | POA: Diagnosis not present

## 2016-12-20 DIAGNOSIS — I10 Essential (primary) hypertension: Secondary | ICD-10-CM | POA: Diagnosis not present

## 2016-12-20 DIAGNOSIS — E1122 Type 2 diabetes mellitus with diabetic chronic kidney disease: Secondary | ICD-10-CM | POA: Diagnosis not present

## 2017-01-19 DIAGNOSIS — L851 Acquired keratosis [keratoderma] palmaris et plantaris: Secondary | ICD-10-CM | POA: Diagnosis not present

## 2017-01-19 DIAGNOSIS — B351 Tinea unguium: Secondary | ICD-10-CM | POA: Diagnosis not present

## 2017-01-19 DIAGNOSIS — E1142 Type 2 diabetes mellitus with diabetic polyneuropathy: Secondary | ICD-10-CM | POA: Diagnosis not present

## 2017-03-23 DIAGNOSIS — E6609 Other obesity due to excess calories: Secondary | ICD-10-CM | POA: Diagnosis not present

## 2017-03-23 DIAGNOSIS — E782 Mixed hyperlipidemia: Secondary | ICD-10-CM | POA: Diagnosis not present

## 2017-03-23 DIAGNOSIS — D509 Iron deficiency anemia, unspecified: Secondary | ICD-10-CM | POA: Diagnosis not present

## 2017-03-23 DIAGNOSIS — Z008 Encounter for other general examination: Secondary | ICD-10-CM | POA: Diagnosis not present

## 2017-03-23 DIAGNOSIS — E1129 Type 2 diabetes mellitus with other diabetic kidney complication: Secondary | ICD-10-CM | POA: Diagnosis not present

## 2017-03-26 DIAGNOSIS — E1122 Type 2 diabetes mellitus with diabetic chronic kidney disease: Secondary | ICD-10-CM | POA: Diagnosis not present

## 2017-03-26 DIAGNOSIS — E119 Type 2 diabetes mellitus without complications: Secondary | ICD-10-CM | POA: Diagnosis not present

## 2017-03-26 DIAGNOSIS — E6609 Other obesity due to excess calories: Secondary | ICD-10-CM | POA: Diagnosis not present

## 2017-03-26 DIAGNOSIS — E1165 Type 2 diabetes mellitus with hyperglycemia: Secondary | ICD-10-CM | POA: Diagnosis not present

## 2017-04-17 DIAGNOSIS — L851 Acquired keratosis [keratoderma] palmaris et plantaris: Secondary | ICD-10-CM | POA: Diagnosis not present

## 2017-04-17 DIAGNOSIS — B351 Tinea unguium: Secondary | ICD-10-CM | POA: Diagnosis not present

## 2017-04-17 DIAGNOSIS — E1142 Type 2 diabetes mellitus with diabetic polyneuropathy: Secondary | ICD-10-CM | POA: Diagnosis not present

## 2017-09-11 DIAGNOSIS — L851 Acquired keratosis [keratoderma] palmaris et plantaris: Secondary | ICD-10-CM | POA: Diagnosis not present

## 2017-09-11 DIAGNOSIS — E1142 Type 2 diabetes mellitus with diabetic polyneuropathy: Secondary | ICD-10-CM | POA: Diagnosis not present

## 2017-09-11 DIAGNOSIS — B351 Tinea unguium: Secondary | ICD-10-CM | POA: Diagnosis not present

## 2017-11-28 ENCOUNTER — Ambulatory Visit (INDEPENDENT_AMBULATORY_CARE_PROVIDER_SITE_OTHER): Payer: Self-pay | Admitting: Orthopaedic Surgery

## 2017-12-03 DIAGNOSIS — E1129 Type 2 diabetes mellitus with other diabetic kidney complication: Secondary | ICD-10-CM | POA: Diagnosis not present

## 2017-12-03 DIAGNOSIS — I10 Essential (primary) hypertension: Secondary | ICD-10-CM | POA: Diagnosis not present

## 2017-12-03 DIAGNOSIS — E782 Mixed hyperlipidemia: Secondary | ICD-10-CM | POA: Diagnosis not present

## 2017-12-03 DIAGNOSIS — F419 Anxiety disorder, unspecified: Secondary | ICD-10-CM | POA: Diagnosis not present

## 2017-12-05 ENCOUNTER — Ambulatory Visit (INDEPENDENT_AMBULATORY_CARE_PROVIDER_SITE_OTHER): Payer: Self-pay | Admitting: Orthopaedic Surgery

## 2017-12-12 ENCOUNTER — Ambulatory Visit (INDEPENDENT_AMBULATORY_CARE_PROVIDER_SITE_OTHER): Payer: PPO

## 2017-12-12 ENCOUNTER — Ambulatory Visit (INDEPENDENT_AMBULATORY_CARE_PROVIDER_SITE_OTHER): Payer: PPO | Admitting: Orthopaedic Surgery

## 2017-12-12 ENCOUNTER — Ambulatory Visit (INDEPENDENT_AMBULATORY_CARE_PROVIDER_SITE_OTHER): Payer: Self-pay | Admitting: Orthopaedic Surgery

## 2017-12-12 ENCOUNTER — Encounter (INDEPENDENT_AMBULATORY_CARE_PROVIDER_SITE_OTHER): Payer: Self-pay | Admitting: Orthopaedic Surgery

## 2017-12-12 VITALS — BP 150/67 | HR 62 | Resp 16 | Ht 62.0 in | Wt 160.0 lb

## 2017-12-12 DIAGNOSIS — M25551 Pain in right hip: Secondary | ICD-10-CM

## 2017-12-12 DIAGNOSIS — G8929 Other chronic pain: Secondary | ICD-10-CM

## 2017-12-12 DIAGNOSIS — M25561 Pain in right knee: Secondary | ICD-10-CM

## 2017-12-12 MED ORDER — LIDOCAINE HCL 1 % IJ SOLN
2.0000 mL | INTRAMUSCULAR | Status: AC | PRN
  Administered 2017-12-12: 2 mL

## 2017-12-12 MED ORDER — BUPIVACAINE HCL 0.5 % IJ SOLN
2.0000 mL | INTRAMUSCULAR | Status: AC | PRN
  Administered 2017-12-12: 2 mL via INTRA_ARTICULAR

## 2017-12-12 MED ORDER — METHYLPREDNISOLONE ACETATE 40 MG/ML IJ SUSP
80.0000 mg | INTRAMUSCULAR | Status: AC | PRN
  Administered 2017-12-12: 80 mg

## 2017-12-12 NOTE — Progress Notes (Signed)
Office Visit Note   Patient: Caitlin Reed           Date of Birth: 05-31-1936           MRN: [id number] Visit Date: 12/12/2017              Requested by: Lemmie Evens, MD [address] [place] [address] PCP: Lemmie Evens, MD   Assessment & Plan: Visit Diagnoses:  1. Pain in right hip   2. Chronic pain of right knee     Plan: Primary osteoarthritis right hip and right knee.  Long discussion with Caitlin Reed and her daughter regarding the above.  We will proceed with a cortisone injection of right knee and monitor response.  Might be a candidate for injection of cortisone right hip as she is not interested in any surgery.  Long discussion regarding surgical intervention for both her hip and her knee.  Not interested in surgery at this point.  Girth of right leg is larger than the left probably on the basis of the arthritis in her right hip and right knee with some venous stasis changes  Follow-Up Instructions: Return if symptoms worsen or fail to improve.   Orders:  Orders Placed This Encounter  Procedures  . XR Pelvis 1-2 Views   No orders of the defined types were placed in this encounter.     Procedures: Large Joint Inj: R knee on 12/12/2017 11:17 AM Indications: pain and diagnostic evaluation Details: 25 G 1.5 in needle, anteromedial approach  Arthrogram: No  Medications: 2 mL lidocaine 1 %; 2 mL bupivacaine 0.5 %; 80 mg methylPREDNISolone acetate 40 MG/ML Procedure, treatment alternatives, risks and benefits explained, specific risks discussed. Consent was given by the patient. Immediately prior to procedure a time out was called to verify the correct patient, procedure, equipment, support staff and site/side marked as required. Patient was prepped and draped in the usual sterile fashion.       Clinical Data: No additional findings.   Subjective: Chief Complaint  Patient presents with  . Right Knee - Pain  . Follow-up    R KNEE PAIN,  SWELLING,OA. HAS SWELLING AND POPPING. WOULD LIKE INJECTION  Caitlin Reed is 82 years old and accompanied by her daughter.  She is here for follow-up evaluation of her right knee osteoarthritis.  She was seen several years ago for evaluation with x-rays demonstrating significant tricompartmental arthritis.  She did respond to cortisone and subsequent Visco supplementation.  She has had slow recurrence of her pain.  She does use a cane.  She is experiencing pain in the area of her thigh as well as her right knee.  No history of injury or trauma.  No back pain  HPI  Review of Systems  Constitutional: Positive for fatigue.  HENT: Negative for ear pain.   Eyes: Negative for pain.  Respiratory: Negative for cough.   Cardiovascular: Positive for leg swelling.  Gastrointestinal: Negative for diarrhea.  Genitourinary: Negative for difficulty urinating.  Musculoskeletal: Negative for back pain.  Skin: Negative for rash.  Allergic/Immunologic: Negative for food allergies.  Neurological: Positive for weakness.  Hematological: Bruises/bleeds easily.  Psychiatric/Behavioral: Positive for sleep disturbance.     Objective: Vital Signs: BP (!) 150/67 (BP Location: Left Arm, Patient Position: Sitting, Cuff Size: Normal)   Pulse 62   Resp 16   Ht 5\' 2"  (1.575 m)   Wt 160 lb (72.6 kg)   BMI 29.26 kg/m   Physical Exam  Constitutional:  She is oriented to person, place, and time. She appears well-developed and well-nourished.  HENT:  Mouth/Throat: Oropharynx is clear and moist.  Eyes: Pupils are equal, round, and reactive to light. EOM are normal.  Pulmonary/Chest: Effort normal.  Neurological: She is alert and oriented to person, place, and time.  Skin: Skin is warm and dry.  Psychiatric: She has a normal mood and affect. Her behavior is normal.    Ortho Exam awake alert and oriented x3.  Walks with the use of a cane.  Right lower extremity is externally rotated.  Right leg is larger in girth  than the left.  Has limited internal/external rotation of her right hip with pain in the groin.  Venous stasis changes of both legs.  +1 pulses.  Neurologically intact.  Right knee with pain predominant medial compartment with slight increased varus with weightbearing.  Full extension about 100 degrees of flexion.  No instability.  No popliteal pain or mass.  No calf pain.  Specialty Comments:  No specialty comments available.  Imaging: Xr Pelvis 1-2 Views  Result Date: 12/12/2017 [facility] the pelvis demonstrated end-stage osteoarthritis right hip.  Joint space is obliterated.  There is subchondral cysts on both sides of the joint as well as sclerosis.  No obvious collapse.  Some arthritis in left hip as well but joint space is still maintained    PMFS History: Patient Active Problem List   Diagnosis Date Noted  . Anemia, iron deficiency 11/13/2013  . High cholesterol 11/13/2013  . Diabetes ([place]) 11/13/2013   Past Medical History:  Diagnosis Date  . Diabetes ([place])    x 24 yrs  . High cholesterol   . Hypertension     History reviewed. No pertinent family history.  Past Surgical History:  Procedure Laterality Date  . COLONOSCOPY N/A 11/26/2013   Procedure: COLONOSCOPY;  Surgeon: Rogene Houston, MD;  [facility];  Service: Endoscopy;  Laterality: N/A;  915  . ESOPHAGOGASTRODUODENOSCOPY N/A 11/26/2013   Procedure: ESOPHAGOGASTRODUODENOSCOPY (EGD);  Surgeon: Rogene Houston, MD;  [facility];  Service: Endoscopy;  Laterality: N/A;  . EYE SURGERY    . none     Social History   Occupational History  . Not on file  Tobacco Use  . Smoking status: Never Smoker  . Smokeless tobacco: Never Used  Substance and Sexual Activity  . Alcohol use: No  . Drug use: No  . Sexual activity: Not on file

## 2018-02-12 DIAGNOSIS — E1142 Type 2 diabetes mellitus with diabetic polyneuropathy: Secondary | ICD-10-CM | POA: Diagnosis not present

## 2018-02-12 DIAGNOSIS — B351 Tinea unguium: Secondary | ICD-10-CM | POA: Diagnosis not present

## 2018-02-12 DIAGNOSIS — L851 Acquired keratosis [keratoderma] palmaris et plantaris: Secondary | ICD-10-CM | POA: Diagnosis not present

## 2018-03-13 ENCOUNTER — Telehealth (INDEPENDENT_AMBULATORY_CARE_PROVIDER_SITE_OTHER): Payer: Self-pay

## 2018-03-13 NOTE — Telephone Encounter (Signed)
Submitted for Monovisc, right knee. 

## 2018-03-21 ENCOUNTER — Telehealth (INDEPENDENT_AMBULATORY_CARE_PROVIDER_SITE_OTHER): Payer: Self-pay

## 2018-03-21 NOTE — Telephone Encounter (Signed)
Please schedule patient appointment with Dr. Durward Fortes. Patient is approved for Monovisc, Right Knee. [place] Patient will be responsible for 20% OOP No PA required Thank You.

## 2018-03-27 ENCOUNTER — Ambulatory Visit (INDEPENDENT_AMBULATORY_CARE_PROVIDER_SITE_OTHER): Payer: PPO | Admitting: Orthopaedic Surgery

## 2018-03-27 ENCOUNTER — Encounter (INDEPENDENT_AMBULATORY_CARE_PROVIDER_SITE_OTHER): Payer: Self-pay | Admitting: Orthopaedic Surgery

## 2018-03-27 VITALS — Ht 62.0 in | Wt 160.0 lb

## 2018-03-27 DIAGNOSIS — M1711 Unilateral primary osteoarthritis, right knee: Secondary | ICD-10-CM

## 2018-03-27 MED ORDER — SODIUM HYALURONATE (VISCOSUP) 25 MG/2.5ML IX SOSY
25.0000 mg | PREFILLED_SYRINGE | INTRA_ARTICULAR | Status: AC | PRN
  Administered 2018-03-27: 25 mg via INTRA_ARTICULAR

## 2018-03-27 NOTE — Progress Notes (Signed)
Office Visit Note   Patient: Caitlin Reed           Date of Birth: [birth year]           MRN: [id number] Visit Date: 03/27/2018              Requested by: Lemmie Evens, MD [address] [place] [address] PCP: Lemmie Evens, MD   Assessment & Plan: Visit Diagnoses:  1. Unilateral primary osteoarthritis, right knee     Plan: First Supartz injection right knee.  Return weekly for the next 2 weeks to complete series  Follow-Up Instructions: Return in about 1 week (around 04/03/2018).   Orders:  Orders Placed This Encounter  Procedures  . Large Joint Inj: R knee   No orders of the defined types were placed in this encounter.     Procedures: Large Joint Inj: R knee on 03/27/2018 3:15 PM Indications: pain and joint swelling Details: 25 G 1.5 in needle  Arthrogram: No  Medications: 25 mg Sodium Hyaluronate 25 MG/2.5ML Outcome: tolerated well, no immediate complications Procedure, treatment alternatives, risks and benefits explained, specific risks discussed. Consent was given by the patient. Immediately prior to procedure a time out was called to verify the correct patient, procedure, equipment, support staff and site/side marked as required. Patient was prepped and draped in the usual sterile fashion.       Clinical Data: No additional findings.   Subjective: Chief Complaint  Patient presents with  . Follow-up    # 1 r knee injection    HPI  Review of Systems  Constitutional: Negative for fatigue and fever.  HENT: Negative for ear pain.   Eyes: Negative for pain.  Respiratory: Negative for cough and shortness of breath.   Cardiovascular: Positive for leg swelling.  Gastrointestinal: Negative for constipation and diarrhea.  Genitourinary: Negative for difficulty urinating.  Musculoskeletal: Negative for back pain and neck pain.  Skin: Negative for rash.  Allergic/Immunologic: Negative for food allergies.  Neurological: Positive for  weakness. Negative for numbness.  Hematological: Does not bruise/bleed easily.  Psychiatric/Behavioral: Positive for sleep disturbance.     Objective: Vital Signs: Ht 5\' 2"  (1.575 m)   Wt 160 lb (72.6 kg)   BMI 29.26 kg/m   Physical Exam  Ortho Exam right knee not hot red or swollen mild to moderate medial joint pain consistent with her arthritis.  No calf pain.  Specialty Comments:  No specialty comments available.  Imaging: No results found.   PMFS History: Patient Active Problem List   Diagnosis Date Noted  . Unilateral primary osteoarthritis, right knee 03/27/2018  . Anemia, iron deficiency 11/13/2013  . High cholesterol 11/13/2013  . Diabetes ([place]) 11/13/2013   Past Medical History:  Diagnosis Date  . Diabetes ([place])    x 24 yrs  . High cholesterol   . Hypertension     History reviewed. No pertinent family history.  Past Surgical History:  Procedure Laterality Date  . COLONOSCOPY N/A 11/26/2013   Procedure: COLONOSCOPY;  Surgeon: Rogene Houston, MD;  [facility];  Service: Endoscopy;  Laterality: N/A;  915  . ESOPHAGOGASTRODUODENOSCOPY N/A 11/26/2013   Procedure: ESOPHAGOGASTRODUODENOSCOPY (EGD);  Surgeon: Rogene Houston, MD;  [facility];  Service: Endoscopy;  Laterality: N/A;  . EYE SURGERY    . none     Social History   Occupational History  . Not on file  Tobacco Use  . Smoking status: Never Smoker  . Smokeless tobacco:  Never Used  Substance and Sexual Activity  . Alcohol use: No  . Drug use: No  . Sexual activity: Not on file

## 2018-03-28 NOTE — Telephone Encounter (Signed)
Patient has already been scheduled for her knee injections in [place] on 03/27/18 and 04/03/18 IJ-Supartz.   Please advise

## 2018-03-29 NOTE — Telephone Encounter (Signed)
Noted  

## 2018-04-03 ENCOUNTER — Ambulatory Visit (INDEPENDENT_AMBULATORY_CARE_PROVIDER_SITE_OTHER): Payer: PPO | Admitting: Orthopaedic Surgery

## 2018-04-03 ENCOUNTER — Encounter (INDEPENDENT_AMBULATORY_CARE_PROVIDER_SITE_OTHER): Payer: Self-pay | Admitting: Orthopaedic Surgery

## 2018-04-03 VITALS — BP 147/68 | HR 60 | Ht 63.0 in | Wt 165.0 lb

## 2018-04-03 DIAGNOSIS — M1711 Unilateral primary osteoarthritis, right knee: Secondary | ICD-10-CM

## 2018-04-03 MED ORDER — SODIUM HYALURONATE (VISCOSUP) 25 MG/2.5ML IX SOSY
25.0000 mg | PREFILLED_SYRINGE | INTRA_ARTICULAR | Status: AC | PRN
  Administered 2018-04-03: 25 mg via INTRA_ARTICULAR

## 2018-04-03 NOTE — Progress Notes (Signed)
Office Visit Note   Patient: Caitlin Reed           Date of Birth: 1936-07-24           MRN: [id number] Visit Date: 04/03/2018              Requested by: Lemmie Evens, MD [address] [place] [address] PCP: Lemmie Evens, MD   Assessment & Plan: Visit Diagnoses:  1. Unilateral primary osteoarthritis, right knee     Plan: Second Supartz injection right knee follow-up 1 week  Follow-Up Instructions: Return in about 1 week (around 04/10/2018).   Orders:  Orders Placed This Encounter  Procedures  . Large Joint Inj: R knee   No orders of the defined types were placed in this encounter.     Procedures: Large Joint Inj: R knee on 04/03/2018 3:25 PM Indications: pain and joint swelling Details: 25 G 1.5 in needle  Arthrogram: No  Medications: 25 mg Sodium Hyaluronate 25 MG/2.5ML Outcome: tolerated well, no immediate complications Procedure, treatment alternatives, risks and benefits explained, specific risks discussed. Consent was given by the patient. Immediately prior to procedure a time out was called to verify the correct patient, procedure, equipment, support staff and site/side marked as required. Patient was prepped and draped in the usual sterile fashion.       Clinical Data: No additional findings.   Subjective: Chief Complaint  Patient presents with  . Follow-up    R KNEE INJECTION #2    HPI  Review of Systems  Constitutional: Negative for fatigue and fever.  HENT: Negative for ear pain.   Eyes: Negative for pain.  Respiratory: Negative for cough and shortness of breath.   Cardiovascular: Negative for leg swelling.  Gastrointestinal: Negative for constipation and diarrhea.  Genitourinary: Negative for difficulty urinating.  Musculoskeletal: Negative for back pain and neck pain.  Skin: Negative for rash.  Allergic/Immunologic: Negative for food allergies.  Neurological: Negative for weakness and numbness.  Hematological: Does  not bruise/bleed easily.  Psychiatric/Behavioral: Negative for sleep disturbance.     Objective: Vital Signs: BP (!) 147/68 (BP Location: Left Arm, Patient Position: Sitting, Cuff Size: Normal)   Pulse 60   Ht 5\' 3"  (1.6 m)   Wt 165 lb (74.8 kg)   BMI 29.23 kg/m   Physical Exam  Ortho Exam knee not hot red warm or swollen  Specialty Comments:  No specialty comments available.  Imaging: No results found.   PMFS History: Patient Active Problem List   Diagnosis Date Noted  . Unilateral primary osteoarthritis, right knee 03/27/2018  . Anemia, iron deficiency 11/13/2013  . High cholesterol 11/13/2013  . Diabetes ([place]) 11/13/2013   Past Medical History:  Diagnosis Date  . Diabetes ([place])    x 24 yrs  . High cholesterol   . Hypertension     History reviewed. No pertinent family history.  Past Surgical History:  Procedure Laterality Date  . COLONOSCOPY N/A 11/26/2013   Procedure: COLONOSCOPY;  Surgeon: Rogene Houston, MD;  [facility];  Service: Endoscopy;  Laterality: N/A;  915  . ESOPHAGOGASTRODUODENOSCOPY N/A 11/26/2013   Procedure: ESOPHAGOGASTRODUODENOSCOPY (EGD);  Surgeon: Rogene Houston, MD;  [facility];  Service: Endoscopy;  Laterality: N/A;  . EYE SURGERY    . none     Social History   Occupational History  . Not on file  Tobacco Use  . Smoking status: Never Smoker  . Smokeless tobacco: Never Used  Substance and  Sexual Activity  . Alcohol use: No  . Drug use: No  . Sexual activity: Not on file

## 2018-04-10 ENCOUNTER — Ambulatory Visit (INDEPENDENT_AMBULATORY_CARE_PROVIDER_SITE_OTHER): Payer: PPO | Admitting: Orthopaedic Surgery

## 2018-04-10 ENCOUNTER — Encounter (INDEPENDENT_AMBULATORY_CARE_PROVIDER_SITE_OTHER): Payer: Self-pay | Admitting: Orthopaedic Surgery

## 2018-04-10 DIAGNOSIS — M1711 Unilateral primary osteoarthritis, right knee: Secondary | ICD-10-CM

## 2018-04-10 MED ORDER — SODIUM HYALURONATE (VISCOSUP) 25 MG/2.5ML IX SOSY
25.0000 mg | PREFILLED_SYRINGE | INTRA_ARTICULAR | Status: AC | PRN
  Administered 2018-04-10: 25 mg via INTRA_ARTICULAR

## 2018-04-10 NOTE — Progress Notes (Deleted)
   Office Visit Note   Patient: Caitlin Reed           Date of Birth: 1936/03/01           MRN: [id number] Visit Date: 04/10/2018              Requested by: Lemmie Evens, MD [address], [place] [address] PCP: Lemmie Evens, MD   Assessment & Plan: Visit Diagnoses:  1. Unilateral primary osteoarthritis, right knee     Plan: ***  Follow-Up Instructions: No follow-ups on file.   Orders:  No orders of the defined types were placed in this encounter.  No orders of the defined types were placed in this encounter.     Procedures: No procedures performed   Clinical Data: No additional findings.   Subjective: No chief complaint on file.   HPI  Review of Systems  Constitutional: Negative for fatigue and fever.  HENT: Negative for ear pain.   Eyes: Negative for pain.  Respiratory: Negative for cough and shortness of breath.   Cardiovascular: Positive for leg swelling.  Gastrointestinal: Negative for constipation and diarrhea.  Genitourinary: Negative for difficulty urinating.  Musculoskeletal: Negative for back pain and neck pain.  Allergic/Immunologic: Negative for food allergies.  Neurological: Positive for weakness.  Hematological: Does not bruise/bleed easily.  Psychiatric/Behavioral: Negative for sleep disturbance.     Objective: Vital Signs: There were no vitals taken for this visit.  Physical Exam  Ortho Exam  Specialty Comments:  No specialty comments available.  Imaging: No results found.   PMFS History: Patient Active Problem List   Diagnosis Date Noted  . Unilateral primary osteoarthritis, right knee 03/27/2018  . Anemia, iron deficiency 11/13/2013  . High cholesterol 11/13/2013  . Diabetes ([place]) 11/13/2013   Past Medical History:  Diagnosis Date  . Diabetes ([place])    x 24 yrs  . High cholesterol   . Hypertension     No family history on file.  Past Surgical History:  Procedure Laterality Date  .  COLONOSCOPY N/A 11/26/2013   Procedure: COLONOSCOPY;  Surgeon: Rogene Houston, MD;  [facility];  Service: Endoscopy;  Laterality: N/A;  915  . ESOPHAGOGASTRODUODENOSCOPY N/A 11/26/2013   Procedure: ESOPHAGOGASTRODUODENOSCOPY (EGD);  Surgeon: Rogene Houston, MD;  [facility];  Service: Endoscopy;  Laterality: N/A;  . EYE SURGERY    . none     Social History   Occupational History  . Not on file  Tobacco Use  . Smoking status: Never Smoker  . Smokeless tobacco: Never Used  Substance and Sexual Activity  . Alcohol use: No  . Drug use: No  . Sexual activity: Not on file

## 2018-04-10 NOTE — Progress Notes (Signed)
   Office Visit Note   Patient: Caitlin Reed           Date of Birth: March 22, 1936           MRN: [id number] Visit Date: 04/10/2018              Requested by: Lemmie Evens, MD [address] [place] [address] PCP: Lemmie Evens, MD   Assessment & Plan: Visit Diagnoses:  1. Unilateral primary osteoarthritis, right knee     Plan: Third Supartz injection right knee.  Return as needed  Follow-Up Instructions: Return if symptoms worsen or fail to improve.   Orders:  No orders of the defined types were placed in this encounter.  No orders of the defined types were placed in this encounter.     Procedures: Large Joint Inj: R knee on 04/10/2018 3:25 PM Indications: pain and joint swelling Details: 25 G 1.5 in needle  Arthrogram: No  Medications: 25 mg Sodium Hyaluronate 25 MG/2.5ML Outcome: tolerated well, no immediate complications Procedure, treatment alternatives, risks and benefits explained, specific risks discussed. Consent was given by the patient. Immediately prior to procedure a time out was called to verify the correct patient, procedure, equipment, support staff and site/side marked as required. Patient was prepped and draped in the usual sterile fashion.       Clinical Data: No additional findings.   Subjective: No chief complaint on file. Caitlin Reed is not sure that the Supartz injections have made much of a difference.  She still is able to get up and around and do a lot of her daily activities.  Still having some aches and pains but no complications related to the injections  HPI  Review of Systems   Objective: Vital Signs: There were no vitals taken for this visit.  Physical Exam  Ortho Exam right knee was not hot red warm or swollen.  No significant pain with range of motion.  Specialty Comments:  No specialty comments available.  Imaging: No results found.   PMFS History: Patient Active Problem List   Diagnosis Date  Noted  . Unilateral primary osteoarthritis, right knee 03/27/2018  . Anemia, iron deficiency 11/13/2013  . High cholesterol 11/13/2013  . Diabetes ([place]) 11/13/2013   Past Medical History:  Diagnosis Date  . Diabetes ([place])    x 24 yrs  . High cholesterol   . Hypertension     History reviewed. No pertinent family history.  Past Surgical History:  Procedure Laterality Date  . COLONOSCOPY N/A 11/26/2013   Procedure: COLONOSCOPY;  Surgeon: Rogene Houston, MD;  [facility];  Service: Endoscopy;  Laterality: N/A;  915  . ESOPHAGOGASTRODUODENOSCOPY N/A 11/26/2013   Procedure: ESOPHAGOGASTRODUODENOSCOPY (EGD);  Surgeon: Rogene Houston, MD;  [facility];  Service: Endoscopy;  Laterality: N/A;  . EYE SURGERY    . none     Social History   Occupational History  . Not on file  Tobacco Use  . Smoking status: Never Smoker  . Smokeless tobacco: Never Used  Substance and Sexual Activity  . Alcohol use: No  . Drug use: No  . Sexual activity: Not on file     Garald Balding, MD   Note - This record has been created using [employer].  Chart creation errors have been sought, but may not always  have been located. Such creation errors do not reflect on  the standard of medical care.

## 2018-05-22 DIAGNOSIS — E119 Type 2 diabetes mellitus without complications: Secondary | ICD-10-CM | POA: Diagnosis not present

## 2018-05-22 DIAGNOSIS — H35373 Puckering of macula, bilateral: Secondary | ICD-10-CM | POA: Diagnosis not present

## 2018-05-22 DIAGNOSIS — H401112 Primary open-angle glaucoma, right eye, moderate stage: Secondary | ICD-10-CM | POA: Diagnosis not present

## 2018-05-22 DIAGNOSIS — H353132 Nonexudative age-related macular degeneration, bilateral, intermediate dry stage: Secondary | ICD-10-CM | POA: Diagnosis not present

## 2018-05-22 DIAGNOSIS — Z961 Presence of intraocular lens: Secondary | ICD-10-CM | POA: Diagnosis not present

## 2018-09-09 DIAGNOSIS — H401112 Primary open-angle glaucoma, right eye, moderate stage: Secondary | ICD-10-CM | POA: Diagnosis not present

## 2018-09-09 DIAGNOSIS — Z961 Presence of intraocular lens: Secondary | ICD-10-CM | POA: Diagnosis not present

## 2018-09-09 DIAGNOSIS — H401121 Primary open-angle glaucoma, left eye, mild stage: Secondary | ICD-10-CM | POA: Diagnosis not present

## 2018-09-17 DIAGNOSIS — B351 Tinea unguium: Secondary | ICD-10-CM | POA: Diagnosis not present

## 2018-09-17 DIAGNOSIS — E1142 Type 2 diabetes mellitus with diabetic polyneuropathy: Secondary | ICD-10-CM | POA: Diagnosis not present

## 2018-09-17 DIAGNOSIS — L851 Acquired keratosis [keratoderma] palmaris et plantaris: Secondary | ICD-10-CM | POA: Diagnosis not present

## 2018-09-30 DIAGNOSIS — Z961 Presence of intraocular lens: Secondary | ICD-10-CM | POA: Diagnosis not present

## 2018-09-30 DIAGNOSIS — H401112 Primary open-angle glaucoma, right eye, moderate stage: Secondary | ICD-10-CM | POA: Diagnosis not present

## 2018-09-30 DIAGNOSIS — H401121 Primary open-angle glaucoma, left eye, mild stage: Secondary | ICD-10-CM | POA: Diagnosis not present

## 2018-11-20 DIAGNOSIS — I10 Essential (primary) hypertension: Secondary | ICD-10-CM | POA: Diagnosis not present

## 2018-11-20 DIAGNOSIS — E1122 Type 2 diabetes mellitus with diabetic chronic kidney disease: Secondary | ICD-10-CM | POA: Diagnosis not present

## 2018-11-20 DIAGNOSIS — F4321 Adjustment disorder with depressed mood: Secondary | ICD-10-CM | POA: Diagnosis not present

## 2018-11-20 DIAGNOSIS — F419 Anxiety disorder, unspecified: Secondary | ICD-10-CM | POA: Diagnosis not present

## 2018-12-02 DIAGNOSIS — E1122 Type 2 diabetes mellitus with diabetic chronic kidney disease: Secondary | ICD-10-CM | POA: Diagnosis not present

## 2018-12-02 DIAGNOSIS — I10 Essential (primary) hypertension: Secondary | ICD-10-CM | POA: Diagnosis not present

## 2018-12-02 DIAGNOSIS — D509 Iron deficiency anemia, unspecified: Secondary | ICD-10-CM | POA: Diagnosis not present

## 2019-01-01 DIAGNOSIS — E1129 Type 2 diabetes mellitus with other diabetic kidney complication: Secondary | ICD-10-CM | POA: Diagnosis not present

## 2019-01-01 DIAGNOSIS — Z794 Long term (current) use of insulin: Secondary | ICD-10-CM | POA: Diagnosis not present

## 2019-01-21 DIAGNOSIS — L851 Acquired keratosis [keratoderma] palmaris et plantaris: Secondary | ICD-10-CM | POA: Diagnosis not present

## 2019-01-21 DIAGNOSIS — E1142 Type 2 diabetes mellitus with diabetic polyneuropathy: Secondary | ICD-10-CM | POA: Diagnosis not present

## 2019-01-21 DIAGNOSIS — B351 Tinea unguium: Secondary | ICD-10-CM | POA: Diagnosis not present

## 2019-01-29 ENCOUNTER — Emergency Department (HOSPITAL_BASED_OUTPATIENT_CLINIC_OR_DEPARTMENT_OTHER): Payer: PPO

## 2019-01-29 ENCOUNTER — Encounter (HOSPITAL_COMMUNITY): Payer: Self-pay | Admitting: Emergency Medicine

## 2019-01-29 ENCOUNTER — Other Ambulatory Visit: Payer: Self-pay

## 2019-01-29 ENCOUNTER — Emergency Department (HOSPITAL_COMMUNITY): Payer: PPO

## 2019-01-29 ENCOUNTER — Inpatient Hospital Stay (HOSPITAL_COMMUNITY)
Admission: EM | Admit: 2019-01-29 | Discharge: 2019-02-01 | DRG: 392 | Disposition: A | Discharge status: Home Health | Payer: PPO | Attending: Internal Medicine | Admitting: Internal Medicine

## 2019-01-29 DIAGNOSIS — K529 Noninfective gastroenteritis and colitis, unspecified: Secondary | ICD-10-CM | POA: Diagnosis not present

## 2019-01-29 DIAGNOSIS — Z1159 Encounter for screening for other viral diseases: Secondary | ICD-10-CM | POA: Diagnosis not present

## 2019-01-29 DIAGNOSIS — E119 Type 2 diabetes mellitus without complications: Secondary | ICD-10-CM | POA: Diagnosis not present

## 2019-01-29 DIAGNOSIS — I361 Nonrheumatic tricuspid (valve) insufficiency: Secondary | ICD-10-CM

## 2019-01-29 DIAGNOSIS — N179 Acute kidney failure, unspecified: Secondary | ICD-10-CM

## 2019-01-29 DIAGNOSIS — K2980 Duodenitis without bleeding: Secondary | ICD-10-CM | POA: Diagnosis not present

## 2019-01-29 DIAGNOSIS — E782 Mixed hyperlipidemia: Secondary | ICD-10-CM | POA: Diagnosis not present

## 2019-01-29 DIAGNOSIS — E875 Hyperkalemia: Secondary | ICD-10-CM | POA: Diagnosis not present

## 2019-01-29 DIAGNOSIS — E1129 Type 2 diabetes mellitus with other diabetic kidney complication: Secondary | ICD-10-CM | POA: Diagnosis not present

## 2019-01-29 DIAGNOSIS — Z03818 Encounter for observation for suspected exposure to other biological agents ruled out: Secondary | ICD-10-CM | POA: Diagnosis not present

## 2019-01-29 DIAGNOSIS — I10 Essential (primary) hypertension: Secondary | ICD-10-CM | POA: Diagnosis present

## 2019-01-29 DIAGNOSIS — H409 Unspecified glaucoma: Secondary | ICD-10-CM | POA: Diagnosis present

## 2019-01-29 DIAGNOSIS — E1122 Type 2 diabetes mellitus with diabetic chronic kidney disease: Secondary | ICD-10-CM | POA: Diagnosis not present

## 2019-01-29 DIAGNOSIS — R110 Nausea: Secondary | ICD-10-CM | POA: Diagnosis not present

## 2019-01-29 DIAGNOSIS — M25451 Effusion, right hip: Secondary | ICD-10-CM | POA: Diagnosis present

## 2019-01-29 DIAGNOSIS — I130 Hypertensive heart and chronic kidney disease with heart failure and stage 1 through stage 4 chronic kidney disease, or unspecified chronic kidney disease: Secondary | ICD-10-CM | POA: Diagnosis not present

## 2019-01-29 DIAGNOSIS — I214 Non-ST elevation (NSTEMI) myocardial infarction: Secondary | ICD-10-CM

## 2019-01-29 DIAGNOSIS — Z7984 Long term (current) use of oral hypoglycemic drugs: Secondary | ICD-10-CM

## 2019-01-29 DIAGNOSIS — Z79899 Other long term (current) drug therapy: Secondary | ICD-10-CM

## 2019-01-29 DIAGNOSIS — I252 Old myocardial infarction: Secondary | ICD-10-CM | POA: Diagnosis not present

## 2019-01-29 DIAGNOSIS — N189 Chronic kidney disease, unspecified: Secondary | ICD-10-CM | POA: Diagnosis present

## 2019-01-29 DIAGNOSIS — M1711 Unilateral primary osteoarthritis, right knee: Secondary | ICD-10-CM | POA: Diagnosis present

## 2019-01-29 DIAGNOSIS — R7989 Other specified abnormal findings of blood chemistry: Secondary | ICD-10-CM | POA: Diagnosis present

## 2019-01-29 DIAGNOSIS — Z7982 Long term (current) use of aspirin: Secondary | ICD-10-CM | POA: Diagnosis not present

## 2019-01-29 DIAGNOSIS — I700 Atherosclerosis of aorta: Secondary | ICD-10-CM | POA: Diagnosis present

## 2019-01-29 DIAGNOSIS — I5020 Unspecified systolic (congestive) heart failure: Secondary | ICD-10-CM | POA: Diagnosis present

## 2019-01-29 DIAGNOSIS — R1909 Other intra-abdominal and pelvic swelling, mass and lump: Secondary | ICD-10-CM | POA: Diagnosis present

## 2019-01-29 DIAGNOSIS — Z833 Family history of diabetes mellitus: Secondary | ICD-10-CM

## 2019-01-29 DIAGNOSIS — Z808 Family history of malignant neoplasm of other organs or systems: Secondary | ICD-10-CM | POA: Diagnosis not present

## 2019-01-29 DIAGNOSIS — D509 Iron deficiency anemia, unspecified: Secondary | ICD-10-CM | POA: Diagnosis present

## 2019-01-29 DIAGNOSIS — I429 Cardiomyopathy, unspecified: Secondary | ICD-10-CM

## 2019-01-29 DIAGNOSIS — I444 Left anterior fascicular block: Secondary | ICD-10-CM | POA: Diagnosis not present

## 2019-01-29 DIAGNOSIS — I255 Ischemic cardiomyopathy: Secondary | ICD-10-CM

## 2019-01-29 DIAGNOSIS — I340 Nonrheumatic mitral (valve) insufficiency: Secondary | ICD-10-CM | POA: Diagnosis not present

## 2019-01-29 DIAGNOSIS — R112 Nausea with vomiting, unspecified: Secondary | ICD-10-CM | POA: Diagnosis not present

## 2019-01-29 DIAGNOSIS — E8809 Other disorders of plasma-protein metabolism, not elsewhere classified: Secondary | ICD-10-CM | POA: Diagnosis present

## 2019-01-29 DIAGNOSIS — E876 Hypokalemia: Secondary | ICD-10-CM | POA: Diagnosis present

## 2019-01-29 DIAGNOSIS — E7800 Pure hypercholesterolemia, unspecified: Secondary | ICD-10-CM | POA: Diagnosis present

## 2019-01-29 DIAGNOSIS — R778 Other specified abnormalities of plasma proteins: Secondary | ICD-10-CM | POA: Diagnosis present

## 2019-01-29 DIAGNOSIS — R197 Diarrhea, unspecified: Secondary | ICD-10-CM | POA: Diagnosis not present

## 2019-01-29 DIAGNOSIS — Z794 Long term (current) use of insulin: Secondary | ICD-10-CM | POA: Diagnosis not present

## 2019-01-29 DIAGNOSIS — K5730 Diverticulosis of large intestine without perforation or abscess without bleeding: Secondary | ICD-10-CM | POA: Diagnosis not present

## 2019-01-29 HISTORY — DX: Essential (primary) hypertension: I10

## 2019-01-29 HISTORY — DX: Mixed hyperlipidemia: E78.2

## 2019-01-29 HISTORY — DX: Cardiomyopathy, unspecified: I42.9

## 2019-01-29 HISTORY — DX: Type 2 diabetes mellitus without complications: E11.9

## 2019-01-29 LAB — BRAIN NATRIURETIC PEPTIDE: B Natriuretic Peptide: 2669 pg/mL — ABNORMAL HIGH (ref 0.0–100.0)

## 2019-01-29 LAB — ECHOCARDIOGRAM COMPLETE
Height: 63 in
Weight: 2640 oz

## 2019-01-29 LAB — COMPREHENSIVE METABOLIC PANEL
ALT: 11 U/L (ref 0–44)
AST: 19 U/L (ref 15–41)
Albumin: 3.1 g/dL — ABNORMAL LOW (ref 3.5–5.0)
Alkaline Phosphatase: 80 U/L (ref 38–126)
Anion gap: 14 (ref 5–15)
BUN: 37 mg/dL — ABNORMAL HIGH (ref 8–23)
CO2: 25 mmol/L (ref 22–32)
Calcium: 9 mg/dL (ref 8.9–10.3)
Chloride: 96 mmol/L — ABNORMAL LOW (ref 98–111)
Creatinine, Ser: 1.55 mg/dL — ABNORMAL HIGH (ref 0.44–1.00)
GFR calc Af Amer: 36 mL/min — ABNORMAL LOW (ref >60)
GFR calc non Af Amer: 31 mL/min — ABNORMAL LOW (ref >60)
Glucose, Bld: 267 mg/dL — ABNORMAL HIGH (ref 70–99)
Potassium: 3.4 mmol/L — ABNORMAL LOW (ref 3.5–5.1)
Sodium: 135 mmol/L (ref 135–145)
Total Bilirubin: 0.6 mg/dL (ref 0.3–1.2)
Total Protein: 7.1 g/dL (ref 6.5–8.1)

## 2019-01-29 LAB — URINALYSIS, ROUTINE W REFLEX MICROSCOPIC
Bilirubin Urine: NEGATIVE
Glucose, UA: NEGATIVE mg/dL
Hgb urine dipstick: NEGATIVE
Ketones, ur: 5 mg/dL — AB
Leukocytes,Ua: NEGATIVE
Nitrite: NEGATIVE
Protein, ur: 30 mg/dL — AB
Specific Gravity, Urine: 1.026 (ref 1.005–1.030)
pH: 5 (ref 5.0–8.0)

## 2019-01-29 LAB — MAGNESIUM: Magnesium: 1.9 mg/dL (ref 1.7–2.4)

## 2019-01-29 LAB — CBC WITH DIFFERENTIAL/PLATELET
Abs Immature Granulocytes: 0.04 10*3/uL (ref 0.00–0.07)
Basophils Absolute: 0.1 10*3/uL (ref 0.0–0.1)
Basophils Relative: 1 %
Eosinophils Absolute: 0 10*3/uL (ref 0.0–0.5)
Eosinophils Relative: 0 %
HCT: 41.2 % (ref 36.0–46.0)
Hemoglobin: 13.1 g/dL (ref 12.0–15.0)
Immature Granulocytes: 1 %
Lymphocytes Relative: 17 %
Lymphs Abs: 1.5 10*3/uL (ref 0.7–4.0)
MCH: 29.8 pg (ref 26.0–34.0)
MCHC: 31.8 g/dL (ref 30.0–36.0)
MCV: 93.8 fL (ref 80.0–100.0)
Monocytes Absolute: 0.9 10*3/uL (ref 0.1–1.0)
Monocytes Relative: 10 %
Neutro Abs: 6.3 10*3/uL (ref 1.7–7.7)
Neutrophils Relative %: 71 %
Platelets: 229 10*3/uL (ref 150–400)
RBC: 4.39 MIL/uL (ref 3.87–5.11)
RDW: 12.5 % (ref 11.5–15.5)
WBC: 8.9 10*3/uL (ref 4.0–10.5)
nRBC: 0 % (ref 0.0–0.2)

## 2019-01-29 LAB — TROPONIN I (HIGH SENSITIVITY)
Troponin I (High Sensitivity): 1083 ng/L (ref <18)
Troponin I (High Sensitivity): 934 ng/L (ref <18)
Troponin I (High Sensitivity): 845 ng/L (ref <18)

## 2019-01-29 LAB — MRSA PCR SCREENING: MRSA by PCR: NEGATIVE

## 2019-01-29 LAB — SARS CORONAVIRUS 2 BY RT PCR (HOSPITAL ORDER, PERFORMED IN ~~LOC~~ HOSPITAL LAB): SARS Coronavirus 2: NEGATIVE

## 2019-01-29 LAB — HEPARIN LEVEL (UNFRACTIONATED): Heparin Unfractionated: 0.46 IU/mL (ref 0.30–0.70)

## 2019-01-29 LAB — GLUCOSE, CAPILLARY
Glucose-Capillary: 214 mg/dL — ABNORMAL HIGH (ref 70–99)
Glucose-Capillary: 174 mg/dL — ABNORMAL HIGH (ref 70–99)

## 2019-01-29 LAB — APTT: aPTT: 23 seconds — ABNORMAL LOW (ref 24–36)

## 2019-01-29 LAB — LIPASE, BLOOD: Lipase: 23 U/L (ref 11–51)

## 2019-01-29 LAB — PROTIME-INR
INR: 1.1 (ref 0.8–1.2)
Prothrombin Time: 14 seconds (ref 11.4–15.2)

## 2019-01-29 LAB — PHOSPHORUS: Phosphorus: 2.9 mg/dL (ref 2.5–4.6)

## 2019-01-29 MED ORDER — PANTOPRAZOLE SODIUM 40 MG IV SOLR
40.0000 mg | Freq: Once | INTRAVENOUS | Status: AC
  Administered 2019-01-29: 14:00:00 40 mg via INTRAVENOUS
  Filled 2019-01-29: qty 40

## 2019-01-29 MED ORDER — PROCHLORPERAZINE EDISYLATE 10 MG/2ML IJ SOLN
5.0000 mg | INTRAMUSCULAR | Status: DC | PRN
  Administered 2019-01-31: 5 mg via INTRAVENOUS
  Filled 2019-01-29: qty 2

## 2019-01-29 MED ORDER — GLIMEPIRIDE 2 MG PO TABS
1.0000 mg | ORAL_TABLET | Freq: Two times a day (BID) | ORAL | Status: DC
  Administered 2019-01-29: 1 mg via ORAL
  Filled 2019-01-29: qty 1

## 2019-01-29 MED ORDER — CHLORHEXIDINE GLUCONATE CLOTH 2 % EX PADS
6.0000 | MEDICATED_PAD | Freq: Every day | CUTANEOUS | Status: DC
  Administered 2019-01-31 – 2019-02-01 (×2): 6 via TOPICAL

## 2019-01-29 MED ORDER — ACETAMINOPHEN 650 MG RE SUPP: 650.0000 mg | Freq: Four times a day (QID) | RECTAL | Status: DC | PRN

## 2019-01-29 MED ORDER — DILTIAZEM HCL 60 MG PO TABS
60.0000 mg | ORAL_TABLET | Freq: Two times a day (BID) | ORAL | Status: DC
  Administered 2019-01-29: 60 mg via ORAL
  Filled 2019-01-29: qty 1

## 2019-01-29 MED ORDER — ASPIRIN 81 MG PO CHEW
324.0000 mg | CHEWABLE_TABLET | Freq: Once | ORAL | Status: AC
  Administered 2019-01-29: 324 mg via ORAL
  Filled 2019-01-29: qty 4

## 2019-01-29 MED ORDER — INSULIN ASPART 100 UNIT/ML ~~LOC~~ SOLN
0.0000 [IU] | Freq: Three times a day (TID) | SUBCUTANEOUS | Status: DC
  Administered 2019-01-29: 3 [IU] via SUBCUTANEOUS
  Administered 2019-01-30: 5 [IU] via SUBCUTANEOUS

## 2019-01-29 MED ORDER — LATANOPROST 0.005 % OP SOLN
1.0000 [drp] | Freq: Every day | OPHTHALMIC | Status: DC
  Administered 2019-01-29 – 2019-01-31 (×3): 1 [drp] via OPHTHALMIC
  Filled 2019-01-29: qty 2.5

## 2019-01-29 MED ORDER — PRAVASTATIN SODIUM 40 MG PO TABS
40.0000 mg | ORAL_TABLET | Freq: Every day | ORAL | Status: DC
  Administered 2019-01-29 – 2019-01-31 (×3): 40 mg via ORAL
  Filled 2019-01-29 (×3): qty 1

## 2019-01-29 MED ORDER — ASPIRIN EC 81 MG PO TBEC
81.0000 mg | DELAYED_RELEASE_TABLET | Freq: Every day | ORAL | Status: DC
  Administered 2019-01-29 – 2019-02-01 (×4): 81 mg via ORAL
  Filled 2019-01-29 (×4): qty 1

## 2019-01-29 MED ORDER — MAGNESIUM SULFATE 2 GM/50ML IV SOLN
2.0000 g | Freq: Once | INTRAVENOUS | Status: AC
  Administered 2019-01-29: 2 g via INTRAVENOUS
  Filled 2019-01-29: qty 50

## 2019-01-29 MED ORDER — PANTOPRAZOLE SODIUM 40 MG IV SOLR
40.0000 mg | Freq: Two times a day (BID) | INTRAVENOUS | Status: DC
  Administered 2019-01-29 – 2019-01-31 (×4): 40 mg via INTRAVENOUS
  Filled 2019-01-29 (×4): qty 40

## 2019-01-29 MED ORDER — LORAZEPAM 0.5 MG PO TABS
0.5000 mg | ORAL_TABLET | Freq: Every day | ORAL | Status: DC | PRN
  Administered 2019-01-29 – 2019-01-31 (×2): 0.5 mg via ORAL
  Filled 2019-01-29 (×2): qty 1

## 2019-01-29 MED ORDER — ACETAMINOPHEN 325 MG PO TABS
650.0000 mg | ORAL_TABLET | Freq: Four times a day (QID) | ORAL | Status: DC | PRN
  Administered 2019-01-31: 650 mg via ORAL
  Filled 2019-01-29: qty 2

## 2019-01-29 MED ORDER — HEPARIN (PORCINE) 25000 UT/250ML-% IV SOLN
800.0000 [IU]/h | INTRAVENOUS | Status: DC
  Administered 2019-01-29: 14:00:00 800 [IU]/h via INTRAVENOUS
  Filled 2019-01-29 (×2): qty 250

## 2019-01-29 MED ORDER — HEPARIN BOLUS VIA INFUSION
4000.0000 [IU] | Freq: Once | INTRAVENOUS | Status: AC
  Administered 2019-01-29: 4000 [IU] via INTRAVENOUS

## 2019-01-29 MED ORDER — ONDANSETRON HCL 4 MG/2ML IJ SOLN
4.0000 mg | INTRAMUSCULAR | Status: DC | PRN
  Administered 2019-01-29: 4 mg via INTRAVENOUS

## 2019-01-29 MED ORDER — POTASSIUM CHLORIDE CRYS ER 20 MEQ PO TBCR: 40.0000 meq | EXTENDED_RELEASE_TABLET | Freq: Once | ORAL | Status: DC

## 2019-01-29 MED ORDER — POTASSIUM CHLORIDE 10 MEQ/100ML IV SOLN
10.0000 meq | INTRAVENOUS | Status: AC
  Administered 2019-01-29 (×3): 10 meq via INTRAVENOUS
  Filled 2019-01-29 (×3): qty 100

## 2019-01-29 MED ORDER — DORZOLAMIDE HCL-TIMOLOL MAL 2-0.5 % OP SOLN
1.0000 [drp] | Freq: Every day | OPHTHALMIC | Status: DC
  Administered 2019-01-29 – 2019-01-31 (×3): 1 [drp] via OPHTHALMIC
  Filled 2019-01-29: qty 10

## 2019-01-29 MED ORDER — SODIUM CHLORIDE 0.9 % IV SOLN
INTRAVENOUS | Status: DC
  Administered 2019-01-29: 12:00:00 via INTRAVENOUS

## 2019-01-29 MED ORDER — POTASSIUM CHLORIDE IN NACL 20-0.45 MEQ/L-% IV SOLN
INTRAVENOUS | Status: DC
  Filled 2019-01-29 (×3): qty 1000

## 2019-01-29 NOTE — Progress Notes (Signed)
ANTICOAGULATION CONSULT NOTE - Initial Consult  Pharmacy Consult for heparin Indication: ACS/STEMI  No Known Allergies  Patient Measurements: Height: 5\' 3"  (160 cm) Weight: 165 lb (74.8 kg) IBW/kg (Calculated) : 52.4 Heparin Dosing Weight: 68.3 kg  Vital Signs: Temp: 98 F (36.7 C) (07/01 1112) Temp Source: Oral (07/01 1112) BP: 95/65 (07/01 1200) Pulse Rate: 93 (07/01 1200)  Labs: Recent Labs    01/29/19 1142  HGB 13.1  HCT 41.2  PLT 229  CREATININE 1.55*  TROPONINIHS 1,083.00*    Estimated Creatinine Clearance: 27.6 mL/min (A) (by C-G formula based on SCr of 1.55 mg/dL (H)).   Medical History: Past Medical History:  Diagnosis Date  . Diabetes ([place])    x 24 yrs  . High cholesterol   . Hypertension     Medications:  (Not in a hospital admission)   Assessment: Pharmacy consulted to dose heparin in patient with ACS/STEMI.  Patient is not on anticoagulation prior to admission.  Goal of Therapy:  Heparin level 0.3-0.7 units/ml Monitor platelets by anticoagulation protocol: Yes   Plan:  Give 4000 units bolus x 1 Start heparin infusion at 800 units/hr Check anti-Xa level in 8 hours and daily while on heparin Continue to monitor H&H and platelets  Margot Ables, PharmD Clinical Pharmacist 01/29/2019 1:25 PM

## 2019-01-29 NOTE — Consult Note (Signed)
Cardiology Consultation:   Patient ID: Caitlin Reed; [id number]; 21-Mar-1936   Admit date: 01/29/2019 Date of Consult: 01/29/2019  Primary Care Provider: Lemmie Evens, MD Consulting Cardiologist: Caitlin Sark, MD   Patient Profile:   Caitlin Reed is an 83 y.o. female with a history of essential hypertension, type 2 diabetes mellitus, hyperlipidemia, and GERD who is being seen today for the evaluation of abnormal ECG and nausea at the request of Dr. Thurnell Reed.  History of Present Illness:   [name] presents to the ER complaining of the onset of nausea and generalized malaise with poor appetite since the weekend.  She describes 1 episode of emesis, just has not felt well in general.  No obvious fevers or chills, no cough or shortness of breath.  Also no chest tightness or palpitations.  She was encouraged to be evaluated per family members.  ECG obtained as part of her work-up was abnormal.  There was initial concern about possible anterior STEMI and tracings were reviewed per Dr. Thurnell Reed with Dr. Burt Reed who did not recommend urgent angiography.  Unfortunately there are no old tracings for comparison.  Initial high-sensitivity troponin levels are 1083 and sequently 934.  Patient does not report any recent exertional chest pain or unusual shortness of breath.  She underwent a cardiac catheterization back in 2002 that demonstrated no significant CAD.  She is not aware of any interval diagnosis of heart disease or myocardial infarction.  Chest x-ray reports no acute process.  Interestingly, CT of the abdomen and pelvis show fairly extensive epigastric inflammatory changes suggestive of duodenitis.  Past Medical History:  Diagnosis Date   Essential hypertension    Mixed hyperlipidemia    Type 2 diabetes mellitus ([place])     Past Surgical History:  Procedure Laterality Date   COLONOSCOPY N/A 11/26/2013   Procedure: COLONOSCOPY;  Surgeon: Caitlin Houston, MD;   Location: [facility];  Service: Endoscopy;  Laterality: N/A;  915   ESOPHAGOGASTRODUODENOSCOPY N/A 11/26/2013   Procedure: ESOPHAGOGASTRODUODENOSCOPY (EGD);  Surgeon: Caitlin Houston, MD;  Location: [facility];  Service: Endoscopy;  Laterality: N/A;   EYE SURGERY       Outpatient Medications: No current facility-administered medications on file prior to encounter.    Current Outpatient Medications on File Prior to Encounter  Medication Sig Dispense Refill   aspirin 81 MG tablet Take 81 mg by mouth daily.     calcium carbonate (OS-CAL) 600 MG TABS tablet Take 1,200 mg by mouth daily.      cholecalciferol (VITAMIN D) 400 UNITS TABS tablet Take 1,000 Units by mouth.     diltiazem (CARDIZEM) 60 MG tablet Take 60 mg by mouth 2 (two) times daily.     dorzolamide-timolol (COSOPT) 22.3-6.8 MG/ML ophthalmic solution Place 1 drop into both eyes at bedtime.     ferrous sulfate (FERROUSUL) 325 (65 FE) MG tablet Take 1 tablet (325 mg total) by mouth daily with breakfast.  0   furosemide (LASIX) 20 MG tablet Take 20 mg by mouth as needed for fluid.      hydrochlorothiazide (HYDRODIURIL) 25 MG tablet Take 25 mg by mouth daily.     latanoprost (XALATAN) 0.005 % ophthalmic solution Place 1 drop into both eyes at bedtime.     LORazepam (ATIVAN) 0.5 MG tablet Take 0.5 mg by mouth daily as needed for anxiety.      omeprazole (PRILOSEC) 20 MG capsule Take 20 mg by mouth daily.     pravastatin (PRAVACHOL) 40  MG tablet Take 40 mg by mouth daily.     glimepiride (AMARYL) 1 MG tablet Take 1 tablet by mouth 2 (two) times a day.      Allergies:   No Known Allergies  Social History:   Social History   Socioeconomic History   Marital status: Married    Spouse name: Not on file   Number of children: Not on file   Years of education: Not on file   Highest education level: Not on file  Occupational History   Not on file  Social Needs   Financial resource strain: Not on file   Food  insecurity    Worry: Not on file    Inability: Not on file   Transportation needs    Medical: Not on file    Non-medical: Not on file  Tobacco Use   Smoking status: Never Smoker   Smokeless tobacco: Never Used  Substance and Sexual Activity   Alcohol use: No   Drug use: No   Sexual activity: Not on file  Lifestyle   Physical activity    Days per week: Not on file    Minutes per session: Not on file   Stress: Not on file  Relationships   Social connections    Talks on phone: Not on file    Gets together: Not on file    Attends religious service: Not on file    Active member of club or organization: Not on file    Attends meetings of clubs or organizations: Not on file    Relationship status: Not on file   Intimate partner violence    Fear of current or ex partner: Not on file    Emotionally abused: Not on file    Physically abused: Not on file    Forced sexual activity: Not on file  Other Topics Concern   Not on file  Social History Narrative   Not on file    Family History:   The patient's family history includes Brain cancer in her father; Diabetes Mellitus II in her mother.  ROS:  Please see the history of present illness.  All other ROS reviewed and negative.     Physical Exam/Data:   Vitals:   01/29/19 1300 01/29/19 1319 01/29/19 1330 01/29/19 1400  BP: (!) 141/80  128/71 109/73  Pulse: 96  91 86  Resp: 17  19 (!) 26  Temp:      TempSrc:      SpO2: 95%  90% 96%  Weight: 74.8 kg     Height: 5\' 3"  (1.6 m) 5\' 3"  (1.6 m)     No intake or output data in the 24 hours ending 01/29/19 1519 Filed Weights   01/29/19 1300  Weight: 74.8 kg   Body mass index is 29.23 kg/m.   Gen: Elderly woman, appears comfortable at rest. HEENT: Conjunctiva and lids normal, oropharynx clear. Neck: Supple, no elevated JVP or carotid bruits, no thyromegaly. Lungs: Clear to auscultation, nonlabored breathing at rest. Cardiac: Regular rate and rhythm, no S3 or  significant systolic murmur, no pericardial rub. Abdomen: Soft, minimally tender, bowel sounds present, no guarding or rebound. Extremities: No pitting edema, distal pulses 2+. Skin: Warm and dry. Musculoskeletal: No kyphosis. Neuropsychiatric: Alert and oriented x3, affect grossly appropriate.  EKG:  I personally reviewed the tracing from 01/29/2019 which shows sinus rhythm with left anterior fascicular block and also changes suggestive of recent anterior infarct with Q waves to V4 and residual ST segment elevation.  Without an old tracing this is difficult to further characterize as there could be an underlying developing IVCD.  Telemetry:  I personally reviewed telemetry which shows sinus rhythm.  Relevant CV Studies:  Cardiac catheterization 12/18/2000: HEMODYNAMICS: 1. Aortic systolic pressure 914, diastolic pressure 70. 2. Left ventricular systolic pressure 782, end-diastolic pressure 21.  SELECTIVE CORONARY ANGIOGRAPHY: 1. Left main:  Normal. 2. Left anterior descending coronary artery:  Normal. 3. Left circumflex:  Dominant and anomalous:  Normal. 4. Right coronary artery:  Nondominant and normal.  LEFT VENTRICULOGRAPHY:  RAO left ventriculogram was performed using 25 cc of Omnipaque dye at 12 cc/sec.  The overall LV-EF was estimated at greater than 60%, without focal wall motion abnormalities.  DISTAL ABDOMINAL AORTOGRAPHY:  Distal abdominal aortogram was performed using 20 cc of Omnipaque dye at 20 cc/sec.  The renal arteries were widely patent. The infrarenal abdominal aorta and iliac bifurcation were free of significant atherosclerotic changes.  Laboratory Data:  Chemistry Recent Labs  Lab 01/29/19 1142  NA 135  K 3.4*  CL 96*  CO2 25  GLUCOSE 267*  BUN 37*  CREATININE 1.55*  CALCIUM 9.0  GFRNONAA 31*  GFRAA 36*  ANIONGAP 14    Recent Labs  Lab 01/29/19 1142  PROT 7.1  ALBUMIN 3.1*  AST 19  ALT 11  ALKPHOS 80  BILITOT 0.6   Hematology Recent  Labs  Lab 01/29/19 1142  WBC 8.9  RBC 4.39  HGB 13.1  HCT 41.2  MCV 93.8  MCH 29.8  MCHC 31.8  RDW 12.5  PLT 229    Radiology/Studies:  Ct Abdomen Pelvis Wo Contrast  Result Date: 01/29/2019 CLINICAL DATA:  Abdominal pain, nausea and vomiting. EXAM: CT ABDOMEN AND PELVIS WITHOUT CONTRAST TECHNIQUE: Multidetector CT imaging of the abdomen and pelvis was performed following the standard protocol without IV contrast. COMPARISON:  None. FINDINGS: Lower chest: The lung bases are clear of an acute process. No pleural effusions. The heart is normal in size. No pericardial effusion. Distal esophagus is grossly normal. Hepatobiliary: No focal hepatic lesions or intrahepatic biliary dilatation. The gallbladder appears normal. No common bile duct dilatation. Pancreas: Largely fatty replaced pancreas. Fairly extensive peripancreatic inflammatory process but the patient's lipases normal and this is unlikely acute pancreatitis given that fact. Spleen: Normal size.  No focal lesions. Adrenals/Urinary Tract: The adrenal glands and kidneys are unremarkable. No renal, ureteral or bladder calculi or obvious mass without contrast. Stomach/Bowel: There is a calcified lesion projecting off the posterior aspect of the gastric fundus. This measures 2.8 cm. It could be a calcified leiomyoma or possibly a gastric diverticulum with chronic debris. Moderate inflammatory changes around the second and third portions of the duodenum along with extensive interstitial changes in the lesser sac, anterior pararenal spaces, upper retroperitoneum and peripancreatic space. This is likely related to duodenitis. I do not see any evidence of free air to suggest a perforated gastric ulcer or duodenal ulcer. The small bowel and colon are grossly normal without oral contrast. The terminal ileum and appendix are normal. Sigmoid colon diverticulosis without findings for acute diverticulitis. Vascular/Lymphatic: Moderate to advanced  atherosclerotic calcifications involving the aorta and branch vessels but no aneurysm. No mesenteric or retroperitoneal mass or adenopathy. Reproductive: The uterus and ovaries are grossly normal. Small cyst associated with the right ovary. Other: Small amount of free pelvic fluid but no pelvic mass or pelvic adenopathy. No inguinal mass or adenopathy. Musculoskeletal: Severe degenerative changes involving both hips, right greater than left with a moderate-sized  right hip joint effusion and probable synovitis. Degenerative lumbar spondylosis with multilevel disc disease and facet disease. IMPRESSION: 1. Fairly extensive epigastric inflammatory process as detailed above. Although this could be pancreatitis the patient's lipase level was normal making this unlikely. This is probably secondary to duodenitis without definite ulcer or perforation. 2. 2.8 cm lesion near the gastric fundus could be a diverticulum or partially calcified leiomyoma. 3. No renal, ureteral or bladder calculi. 4. No CT findings suspicious for acute cholecystitis. 5. Age related vascular calcifications. 6. Severe right hip joint degenerative changes and moderate right hip joint effusion. Electronically Signed   By: Marijo Sanes M.D.   On: 01/29/2019 13:17   Dg Chest 2 View  Result Date: 01/29/2019 CLINICAL DATA:  Nausea, vomiting. EXAM: CHEST - 2 VIEW COMPARISON:  Radiographs of August 11, 2013. FINDINGS: The heart size and mediastinal contours are within normal limits. Both lungs are clear. The visualized skeletal structures are unremarkable. IMPRESSION: No active cardiopulmonary disease. Aortic Atherosclerosis (ICD10-I70.0). Electronically Signed   By: Marijo Conception M.D.   On: 01/29/2019 12:56    Assessment and Plan:   43.  83 year old woman with a history of hypertension, type 2 diabetes mellitus, and hyperlipidemia presenting with recurring nausea and anorexia with at least one episode of emesis, symptoms began over the weekend.   Her ECG is suggestive of recent anterior infarct (no old tracing for comparison however) and high-sensitivity troponin levels are abnormal, although without a precipitous increase on serial measurement.  She does not report any active chest pain or shortness of breath.  Tracings reviewed per Dr. Thurnell Reed with Dr. Burt Reed, decision made against urgent angiography.  Confounding the picture is CT evidence of duodenitis which could also explain her nausea and anorexia.  Previous cardiac work-up in 2002 included a normal cardiac catheterization.  She has not had any interval cardiac testing based on available information.  2.  Essential hypertension by history, outpatient regimen includes HCTZ and Cardizem.  3.  Mixed hyperlipidemia, on Pravachol.  4.  Type 2 diabetes mellitus, on Amaryl.  5.  Renal insufficiency, creatinine 1.55.  Baseline is unknown.  Recommend hospitalization for further work-up and evaluation.  Echocardiogram has been ordered to assess LVEF and evaluate for wall motion abnormalities.  Would continue to trend high-sensitivity troponin levels and follow-up ECG in the morning.  Would treat with aspirin and empiric heparin at this point.  Consider hydration, may need some degree of bowel rest if she does in fact have duodenitis, could discuss the case with gastroenterology.  Her lipase is normal at this time as are LFTs.  She is ultimately going to require ischemic evaluation, can determine mode and timing based on interval work-up and symptoms.  Signed, Rozann Lesches, MD  01/29/2019 3:19 PM

## 2019-01-29 NOTE — ED Notes (Signed)
Echo In Room

## 2019-01-29 NOTE — ED Notes (Signed)
ED Provider at bedside. 

## 2019-01-29 NOTE — Progress Notes (Signed)
Discussed case with Dr. Thurnell Garbe regarding admission.  We will wait for further input from cardiology with repeat troponin pending as well as stat 2D echocardiogram to help guide further evaluation and management with potential need for transfer to [facility] for urgent cardiac catheterization.

## 2019-01-29 NOTE — ED Provider Notes (Signed)
[facility] EMERGENCY DEPARTMENT Provider Note   CSN: [id number] Arrival date & time: 01/29/19  1103     History   Chief Complaint Chief Complaint  Patient presents with   Emesis    HPI Caitlin Reed is a 83 y.o. female.     HPI  Pt was seen at 1130. Per pt, c/o gradual onset and persistence of constant nausea since yesterday. Has been associated with dry heaving and generalized fatigue. Denies CP/palpitations, no SOB/cough, no back pain, no abd pain, no diarrhea, no black or blood in stools or emesis, no focal motor weakness.    Past Medical History:  Diagnosis Date   Diabetes ([place])    x 24 yrs   High cholesterol    Hypertension     Patient Active Problem List   Diagnosis Date Noted   Unilateral primary osteoarthritis, right knee 03/27/2018   Anemia, iron deficiency 11/13/2013   High cholesterol 11/13/2013   Diabetes ([place]) 11/13/2013    Past Surgical History:  Procedure Laterality Date   COLONOSCOPY N/A 11/26/2013   Procedure: COLONOSCOPY;  Surgeon: Rogene Houston, MD;  Location: [facility];  Service: Endoscopy;  Laterality: N/A;  915   ESOPHAGOGASTRODUODENOSCOPY N/A 11/26/2013   Procedure: ESOPHAGOGASTRODUODENOSCOPY (EGD);  Surgeon: Rogene Houston, MD;  Location: [facility];  Service: Endoscopy;  Laterality: N/A;   EYE SURGERY     none       OB History    Gravida      Para      Term      Preterm      AB      Living  0     SAB      TAB      Ectopic      Multiple      Live Births               Home Medications    Prior to Admission medications   Medication Sig Start Date End Date Taking? Authorizing Provider  aspirin 81 MG tablet Take 81 mg by mouth daily.   Yes [provider]  calcium carbonate (OS-CAL) 600 MG TABS tablet Take 1,200 mg by mouth daily.    Yes [provider]  cholecalciferol (VITAMIN D) 400 UNITS TABS tablet Take 1,000 Units by mouth.   Yes [provider]    diltiazem (CARDIZEM) 60 MG tablet Take 60 mg by mouth 2 (two) times daily.   Yes [provider]  dorzolamide-timolol (COSOPT) 22.3-6.8 MG/ML ophthalmic solution Place 1 drop into both eyes at bedtime. 12/25/18  Yes [provider]  ferrous sulfate (FERROUSUL) 325 (65 FE) MG tablet Take 1 tablet (325 mg total) by mouth daily with breakfast. 11/26/13  Yes Rehman, Mechele Dawley, MD  furosemide (LASIX) 20 MG tablet Take 20 mg by mouth as needed for fluid.    Yes [provider]  hydrochlorothiazide (HYDRODIURIL) 25 MG tablet Take 25 mg by mouth daily.   Yes [provider]  latanoprost (XALATAN) 0.005 % ophthalmic solution Place 1 drop into both eyes at bedtime. 11/17/18  Yes [provider]  LORazepam (ATIVAN) 0.5 MG tablet Take 0.5 mg by mouth daily as needed for anxiety.    Yes [provider]  omeprazole (PRILOSEC) 20 MG capsule Take 20 mg by mouth daily.   Yes [provider]  pravastatin (PRAVACHOL) 40 MG tablet Take 40 mg by mouth daily.   Yes [provider]  glimepiride (AMARYL)  1 MG tablet Take 1 tablet by mouth 2 (two) times a day. 10/25/18   [provider]    Family History History reviewed. No pertinent family history.  Social History Social History   Tobacco Use   Smoking status: Never Smoker   Smokeless tobacco: Never Used  Substance Use Topics   Alcohol use: No   Drug use: No     Allergies   Patient has no known allergies.   Review of Systems Review of Systems ROS: Statement: All systems negative except as marked or noted in the HPI; Constitutional: Negative for fever and chills. +fatigue. ; ; Eyes: Negative for eye pain, redness and discharge. ; ; ENMT: Negative for ear pain, hoarseness, nasal congestion, sinus pressure and sore throat. ; ; Cardiovascular: Negative for chest pain, palpitations, diaphoresis, dyspnea and peripheral edema. ; ; Respiratory: Negative for cough, wheezing and  stridor. ; ; Gastrointestinal: +nausea, "dry heaves." Negative for diarrhea, abdominal pain, blood in stool, hematemesis, jaundice and rectal bleeding. . ; ; Genitourinary: Negative for dysuria, flank pain and hematuria. ; ; Musculoskeletal: Negative for back pain and neck pain. Negative for swelling and trauma.; ; Skin: Negative for pruritus, rash, abrasions, blisters, bruising and skin lesion.; ; Neuro: Negative for headache, lightheadedness and neck stiffness. Negative for altered level of consciousness, altered mental status, extremity weakness, paresthesias, involuntary movement, seizure and syncope.       Physical Exam Updated Vital Signs BP 128/71    Pulse 91    Temp 98 F (36.7 C) (Oral)    Resp 19    Ht 5\' 3"  (1.6 m)    Wt 74.8 kg    SpO2 90%    BMI 29.23 kg/m   Patient Vitals for the past 24 hrs:  BP Temp Temp src Pulse Resp SpO2 Height Weight  01/29/19 1330 128/71 -- -- 91 19 90 % -- --  01/29/19 1319 -- -- -- -- -- -- 5\' 3"  (1.6 m) --  01/29/19 1300 (!) 141/80 -- -- 96 17 95 % 5\' 3"  (1.6 m) 74.8 kg  01/29/19 1200 95/65 -- -- 93 (!) 22 96 % -- --  01/29/19 1130 120/66 -- -- 94 -- 93 % -- --  01/29/19 1112 130/73 98 F (36.7 C) Oral 97 18 96 % -- --      Physical Exam 1135: Physical examination:  Nursing notes reviewed; Vital signs and O2 SAT reviewed;  Constitutional: Well developed, Well nourished, In no acute distress; Head:  Normocephalic, atraumatic; Eyes: EOMI, PERRL, No scleral icterus; ENMT: Mouth and pharynx normal, Mucous membranes dry; Neck: Supple, Full range of motion, No lymphadenopathy; Cardiovascular: Regular rate and rhythm, No gallop; Respiratory: Breath sounds clear & equal bilaterally, No wheezes.  Speaking full sentences with ease, Normal respiratory effort/excursion; Chest: Nontender, Movement normal; Abdomen: Soft, +LUQ, mid-epigastric, RUQ tenderness to palp. No rebound or guarding. Nondistended, Normal bowel sounds; Genitourinary: No CVA tenderness;  Extremities: Peripheral pulses normal, No tenderness, No edema, No calf edema or asymmetry.; Neuro: AA&Ox3, Major CN grossly intact.  Speech clear. No gross focal motor or sensory deficits in extremities.; Skin: Color normal, Warm, Dry.   ED Treatments / Results  Labs (all labs ordered are listed, but only abnormal results are displayed)   EKG     EKG Interpretation  Date/Time:  Wednesday January 29 2019 11:56:37 EDT   #1 EKG Ventricular Rate:  96 PR Interval:    QRS Duration: 108 QT Interval:  384 QTC Calculation: 486 R Axis:   -  45 Text Interpretation:  Sinus rhythm Ventricular premature complex Left anterior fascicular block Left ventricular hypertrophy Nonspecific ST and T wave abnormality Anterolateral leads When compared with ECG of 12/18/2000 Nonspecific ST and T wave abnormality is now Present Confirmed by Francine Graven [phone]) on 01/29/2019 1:56:41 PM        EKG Interpretation  Date/Time:  Wednesday January 29 2019 12:09:20 EDT  #2 EKG Ventricular Rate:  93 PR Interval:    QRS Duration: 108 QT Interval:  393 QTC Calculation: 489 R Axis:   -43 Text Interpretation:  Sinus rhythm Multiple ventricular premature complexes Left anterior fascicular block Left ventricular hypertrophy Nonspecific ST and T wave abnormality Anterolateral leads Since last tracing of earlier today No significant change was found Confirmed by Francine Graven [phone]) on 01/29/2019 1:59:40 PM        EKG Interpretation  Date/Time:  Wednesday January 29 2019 14:01:42 EDT  #3 EKG Ventricular Rate:  93 PR Interval:    QRS Duration: 106 QT Interval:  377 QTC Calculation: 469 R Axis:   -40 Text Interpretation:  Sinus rhythm Left anterior fascicular block Left ventricular hypertrophy Probable anterior infarct, age indeterminate Since last tracing of earlier today No significant change was found Confirmed by Francine Graven [phone]) on 01/29/2019 2:10:33 PM          Radiology    Procedures Procedures (including critical care time)  Medications Ordered in ED Medications  ondansetron (ZOFRAN) injection 4 mg (4 mg Intravenous Given 01/29/19 1158)  0.9 %  sodium chloride infusion ( Intravenous New Bag/Given 01/29/19 1156)  heparin ADULT infusion 100 units/mL (25000 units/291mL sodium chloride 0.45%) (800 Units/hr Intravenous New Bag/Given 01/29/19 1359)  aspirin chewable tablet 324 mg (324 mg Oral Given 01/29/19 1258)  heparin bolus via infusion 4,000 Units (4,000 Units Intravenous Bolus from Bag 01/29/19 1356)  pantoprazole (PROTONIX) injection 40 mg (40 mg Intravenous Given 01/29/19 1352)     Initial Impression / Assessment and Plan / ED Course   I have reviewed the triage vital signs and the nursing notes.   Pertinent labs & imaging results that were available during my care of the patient were reviewed by me and considered in my medical decision making (see chart for details).     MDM Reviewed: previous chart, nursing note and vitals Reviewed previous: labs and ECG Interpretation: labs, ECG, x-ray and CT scan Total time providing critical care: 30-74 minutes. This excludes time spent performing separately reportable procedures and services. Consults: admitting MD and cardiology   CRITICAL CARE Performed by: Francine Graven Total critical care time: 55 minutes Critical care time was exclusive of separately billable procedures and treating other patients. Critical care was necessary to treat or prevent imminent or life-threatening deterioration. Critical care was time spent personally by me on the following activities: development of treatment plan with patient and/or surrogate as well as nursing, discussions with consultants, evaluation of patient's response to treatment, examination of patient, obtaining history from patient or surrogate, ordering and performing treatments and interventions, ordering and review of laboratory studies,  ordering and review of radiographic studies, pulse oximetry and re-evaluation of patient's condition.   Results for orders placed or performed during the hospital encounter of 01/29/19  Comprehensive metabolic panel  Result Value Ref Range   Sodium 135 135 - 145 mmol/L   Potassium 3.4 (L) 3.5 - 5.1 mmol/L   Chloride 96 (L) 98 - 111 mmol/L   CO2 25 22 - 32 mmol/L   Glucose, Bld 267 (H) 70 -  99 mg/dL   BUN 37 (H) 8 - 23 mg/dL   Creatinine, Ser 1.55 (H) 0.44 - 1.00 mg/dL   Calcium 9.0 8.9 - 10.3 mg/dL   Total Protein 7.1 6.5 - 8.1 g/dL   Albumin 3.1 (L) 3.5 - 5.0 g/dL   AST 19 15 - 41 U/L   ALT 11 0 - 44 U/L   Alkaline Phosphatase 80 38 - 126 U/L   Total Bilirubin 0.6 0.3 - 1.2 mg/dL   GFR calc non Af Amer 31 (L) >60 mL/min   GFR calc Af Amer 36 (L) >60 mL/min   Anion gap 14 5 - 15  Lipase, blood  Result Value Ref Range   Lipase 23 11 - 51 U/L  Troponin I (High Sensitivity)  Result Value Ref Range   Troponin I (High Sensitivity) 1,083.00 (HH) <18 ng/L  Troponin I (High Sensitivity)  Result Value Ref Range   Troponin I (High Sensitivity) 934.00 (HH) <18 ng/L  CBC with Differential  Result Value Ref Range   WBC 8.9 4.0 - 10.5 K/uL   RBC 4.39 3.87 - 5.11 MIL/uL   Hemoglobin 13.1 12.0 - 15.0 g/dL   HCT 41.2 36.0 - 46.0 %   MCV 93.8 80.0 - 100.0 fL   MCH 29.8 26.0 - 34.0 pg   MCHC 31.8 30.0 - 36.0 g/dL   RDW 12.5 11.5 - 15.5 %   Platelets 229 150 - 400 K/uL   nRBC 0.0 0.0 - 0.2 %   Neutrophils Relative % 71 %   Neutro Abs 6.3 1.7 - 7.7 K/uL   Lymphocytes Relative 17 %   Lymphs Abs 1.5 0.7 - 4.0 K/uL   Monocytes Relative 10 %   Monocytes Absolute 0.9 0.1 - 1.0 K/uL   Eosinophils Relative 0 %   Eosinophils Absolute 0.0 0.0 - 0.5 K/uL   Basophils Relative 1 %   Basophils Absolute 0.1 0.0 - 0.1 K/uL   Immature Granulocytes 1 %   Abs Immature Granulocytes 0.04 0.00 - 0.07 K/uL  APTT  Result Value Ref Range   aPTT 23 (L) 24 - 36 seconds  Protime-INR  Result Value Ref  Range   Prothrombin Time 14.0 11.4 - 15.2 seconds   INR 1.1 0.8 - 1.2     Ct Abdomen Pelvis Wo Contrast Result Date: 01/29/2019 CLINICAL DATA:  Abdominal pain, nausea and vomiting. EXAM: CT ABDOMEN AND PELVIS WITHOUT CONTRAST TECHNIQUE: Multidetector CT imaging of the abdomen and pelvis was performed following the standard protocol without IV contrast. COMPARISON:  None. FINDINGS: Lower chest: The lung bases are clear of an acute process. No pleural effusions. The heart is normal in size. No pericardial effusion. Distal esophagus is grossly normal. Hepatobiliary: No focal hepatic lesions or intrahepatic biliary dilatation. The gallbladder appears normal. No common bile duct dilatation. Pancreas: Largely fatty replaced pancreas. Fairly extensive peripancreatic inflammatory process but the patient's lipases normal and this is unlikely acute pancreatitis given that fact. Spleen: Normal size.  No focal lesions. Adrenals/Urinary Tract: The adrenal glands and kidneys are unremarkable. No renal, ureteral or bladder calculi or obvious mass without contrast. Stomach/Bowel: There is a calcified lesion projecting off the posterior aspect of the gastric fundus. This measures 2.8 cm. It could be a calcified leiomyoma or possibly a gastric diverticulum with chronic debris. Moderate inflammatory changes around the second and third portions of the duodenum along with extensive interstitial changes in the lesser sac, anterior pararenal spaces, upper retroperitoneum and peripancreatic space. This is likely related to  duodenitis. I do not see any evidence of free air to suggest a perforated gastric ulcer or duodenal ulcer. The small bowel and colon are grossly normal without oral contrast. The terminal ileum and appendix are normal. Sigmoid colon diverticulosis without findings for acute diverticulitis. Vascular/Lymphatic: Moderate to advanced atherosclerotic calcifications involving the aorta and branch vessels but no aneurysm.  No mesenteric or retroperitoneal mass or adenopathy. Reproductive: The uterus and ovaries are grossly normal. Small cyst associated with the right ovary. Other: Small amount of free pelvic fluid but no pelvic mass or pelvic adenopathy. No inguinal mass or adenopathy. Musculoskeletal: Severe degenerative changes involving both hips, right greater than left with a moderate-sized right hip joint effusion and probable synovitis. Degenerative lumbar spondylosis with multilevel disc disease and facet disease. IMPRESSION: 1. Fairly extensive epigastric inflammatory process as detailed above. Although this could be pancreatitis the patient's lipase level was normal making this unlikely. This is probably secondary to duodenitis without definite ulcer or perforation. 2. 2.8 cm lesion near the gastric fundus could be a diverticulum or partially calcified leiomyoma. 3. No renal, ureteral or bladder calculi. 4. No CT findings suspicious for acute cholecystitis. 5. Age related vascular calcifications. 6. Severe right hip joint degenerative changes and moderate right hip joint effusion. Electronically Signed   By: Marijo Sanes M.D.   On: 01/29/2019 13:17   Dg Chest 2 View Result Date: 01/29/2019 CLINICAL DATA:  Nausea, vomiting. EXAM: CHEST - 2 VIEW COMPARISON:  Radiographs of August 11, 2013. FINDINGS: The heart size and mediastinal contours are within normal limits. Both lungs are clear. The visualized skeletal structures are unremarkable. IMPRESSION: No active cardiopulmonary disease. Aortic Atherosclerosis (ICD10-I70.0). Electronically Signed   By: Marijo Conception M.D.   On: 01/29/2019 12:56    Caitlin Reed was evaluated in Emergency Department on 01/29/2019 for the symptoms described in the history of present illness. She was evaluated in the context of the global COVID-19 pandemic, which necessitated consideration that the patient might be at risk for infection with the SARS-CoV-2 virus that causes COVID-19.  Institutional protocols and algorithms that pertain to the evaluation of patients at risk for COVID-19 are in a state of rapid change based on information released by regulatory bodies including the CDC and federal and state organizations. These policies and algorithms were followed during the patient's care in the ED.    1210:  Initial EKG with STE in anterior leads with reciprocal changes lateral leads. EKG was repeated while [facility] STEMI MD was paged. Pt continues to deny CP/SOB/back pain and only c/o nausea. Troponin is pending.  T/C returned from [facility] STEMI Dr. Burt Knack, case discussed, including:  HPI, pertinent PM/SHx, VS/PE, dx testing, ED course and treatment:  He has viewed the EKG, states this is NOT Code STEMI at this time given anterior Q-waves and morphology of ST segment elevation although pt does have changes suggestive of coronary disease, if pt is admitted she will need an Echocardiogram.   1310:  ASA already given. Initial troponin elevated. T/C returned from [facility] Cards Dr. Domenic Polite, case discussed, including:  HPI, pertinent PM/SHx, VS/PE, dx testing, ED course and treatment, as well as d/w [facility] STEMI MD:  Agreeable to consult, requests to obtain delta trop and echocardiogram now, start IV heparin, pt may need transfer to [facility] (vs stay at [facility]), call [facility] MD to admit.   1325:  CT as above; IV protonix ordered.  T/C returned from [facility] Dr. Manuella Ghazi, case discussed, including:  HPI, pertinent PM/SHx,  VS/PE, dx testing, ED course and treatment, as well as d/w Cards MD's x2:  Agreeable to admit, please have Cards confirm [facility] vs [facility] after obtain 2nd troponin and echocardiogram.   1445:  2nd troponin decreasing. Echocardiogram currently being performed.         Final Clinical Impressions(s) / ED Diagnoses   Final diagnoses:  None    ED Discharge Orders    None       Francine Graven, DO 02/01/19 [id number]

## 2019-01-29 NOTE — ED Notes (Signed)
Pt is advised a urine sample is needed. Pt stated she does not need to go at this time.

## 2019-01-29 NOTE — ED Notes (Signed)
Date and time results received: 01/29/19 1425 (use smartphrase ".now" to insert current time)  Test: Troponin Critical Value: 934  Name of Provider Notified: Dr. Thurnell Garbe  Orders Received? Or Actions Taken?: n/a

## 2019-01-29 NOTE — ED Triage Notes (Signed)
Patient c/o nausea and emesis that began this am.

## 2019-01-29 NOTE — H&P (Signed)
History and Physical    Caitlin Reed Caitlin Reed DOB: 01-Jul-1936 DOA: 01/29/2019  PCP: Lemmie Evens, MD   Patient coming from: Home.  I have personally briefly reviewed patient's old medical records in [place]  Chief Complaint: Nausea and vomiting.  HPI: Caitlin Reed is a 83 y.o. female with medical history significant of hypertension, mixed hyperlipidemia, type 2 diabetes, glaucoma, elevated BMI at 29.23 kg/m who comes to the emergency department due to decreased appetite, heartburn/indigestion, nausea/dry heaving since the weekend, particularly more intense since yesterday evening this morning associated with fatigue and malaise.  She reports having some mild chest pressure over the weekend.  However, she denies having dyspnea, emesis, dizziness, palpitations, diaphoresis, PND, orthopnea or recent pitting edema of the lower extremities.  She denies fever, chills, sore throat, wheezing, hemoptysis, diarrhea, constipation, melena or hematochezia.  No dysuria, frequency or hematuria.  States her blood glucose happening under control and denies polyuria, polydipsia, polyphagia or blurred vision.  ED Course: Initial vital signs temperature 98 F, pulse 97, respiration 18, blood pressure 130/73 mmHg and O2 sat 96% on room air.  The patient received 324 mg of chewable aspirin, oral potassium supplementation and was started on a heparin infusion.  Cardiology was consulted and evaluated the patient in the ED.  Please see their notes for further detail.  EKG was sinus rhythm with VPC, left anterior fascicular block, LVH and nonspecific ST segment and T wave abnormalities.Troponin initially was 1083, then 934 then 845 ng/L.  BNP was 2669.0 pg/mL.  Urinalysis shows ketonuria of 5 and proteinuria of 30 mg/dL with rare bacteria on microscopic examination.  CBC was normal.  INR was 1.1, PT was 14 and APTT 23 seconds.  Lipase was normal.  CMP showed a potassium of 3.4 and chloride of  96 mmol/L.  All other electrolytes were normal.  Glucose 267, BUN 37 creatinine 1.55 mg/dL.  Albumin was 3.1 g/dL, the rest of the hepatic function tests were normal.  SARS coronavirus 2 nasal swab was negative.  Imaging: Her chest radiograph showed no active cardiopulmonary pathology.  CT abdomen/pelvis showed fairly extensive epigastric inflammatory process as detailed above.  Duodenitis suspected.  There is a 2.8 cm lesion near the gastric fundus who could be a diverticulum or partially calcified leiomyoma.  There were age-related vascular calcifications.  There is severe right hip joint DJD and moderate right hip joint effusion.  Review of Systems: As per HPI otherwise 10 point review of systems negative.   Past Medical History:  Diagnosis Date   Essential hypertension    Mixed hyperlipidemia     Type 2 diabetes mellitus ([place]) Glaucoma     Past Surgical History:  Procedure Laterality Date   COLONOSCOPY N/A 11/26/2013   Procedure: COLONOSCOPY;  Surgeon: Rogene Houston, MD;  Location: [facility];  Service: Endoscopy;  Laterality: N/A;  915   ESOPHAGOGASTRODUODENOSCOPY N/A 11/26/2013   Procedure: ESOPHAGOGASTRODUODENOSCOPY (EGD);  Surgeon: Rogene Houston, MD;  Location: [facility];  Service: Endoscopy;  Laterality: N/A;   EYE SURGERY       reports that she has never smoked. She has never used smokeless tobacco. She reports that she does not drink alcohol or use drugs.  No Known Allergies  Family History  Problem Relation Age of Onset   Diabetes Mellitus II Mother    Brain cancer Father    Prior to Admission medications   Medication Sig Start Date End Date Taking? Authorizing Provider  aspirin 81 MG  tablet Take 81 mg by mouth daily.   Yes [provider]  calcium carbonate (OS-CAL) 600 MG TABS tablet Take 1,200 mg by mouth daily.    Yes [provider]  cholecalciferol (VITAMIN D) 400 UNITS TABS tablet Take 1,000 Units by mouth.   Yes [provider]  diltiazem (CARDIZEM) 60 MG tablet Take 60 mg by mouth 2 (two) times daily.   Yes [provider]  dorzolamide-timolol (COSOPT) 22.3-6.8 MG/ML ophthalmic solution Place 1 drop into both eyes at bedtime. 12/25/18  Yes [provider]  ferrous sulfate (FERROUSUL) 325 (65 FE) MG tablet Take 1 tablet (325 mg total) by mouth daily with breakfast. 11/26/13  Yes Rehman, Mechele Dawley, MD  furosemide (LASIX) 20 MG tablet Take 20 mg by mouth as needed for fluid.    Yes [provider]  hydrochlorothiazide (HYDRODIURIL) 25 MG tablet Take 25 mg by mouth daily.   Yes [provider]  latanoprost (XALATAN) 0.005 % ophthalmic solution Place 1 drop into both eyes at bedtime. 11/17/18  Yes [provider]  LORazepam (ATIVAN) 0.5 MG tablet Take 0.5 mg by mouth daily as needed for anxiety.    Yes [provider]  omeprazole (PRILOSEC) 20 MG capsule Take 20 mg by mouth daily.   Yes [provider]  pravastatin (PRAVACHOL) 40 MG tablet Take 40 mg by mouth daily.   Yes [provider]  glimepiride (AMARYL) 1 MG tablet Take 1 tablet by mouth 2 (two) times a day. 10/25/18   [provider]    Physical Exam: Vitals:   01/29/19 1400 01/29/19 1430 01/29/19 1500 01/29/19 1530  BP: 109/73 115/71 117/64 116/65  Pulse: 86 93 90   Resp: (!) 26 16 20    Temp:      TempSrc:      SpO2: 96% 96% 92%   Weight:      Height:        Constitutional: NAD, calm, comfortable Eyes: PERRL, lids and conjunctivae normal ENMT: Mucous membranes are moist. Posterior pharynx clear of any exudate or lesions. Neck: normal, supple, no masses, no thyromegaly Respiratory: clear to auscultation bilaterally, no wheezing, no crackles. Normal respiratory effort. No accessory muscle use.  Cardiovascular: Regular rate and rhythm, no murmurs / rubs / gallops.  Stage I lymphedema.  No pitting lower extremity edema. 2+ pedal pulses. No carotid bruits.  Abdomen:  Soft, very mild epigastric tenderness, no guarding or rebound, no masses palpated. No hepatosplenomegaly. Bowel sounds positive.  Musculoskeletal: no clubbing / cyanosis. Good ROM, no contractures. Normal muscle tone.  Skin: no rashes, lesions, ulcers on limited dermatological examination. Neurologic: CN 2-12 grossly intact. Sensation intact, DTR normal. Strength 5/5 in all 4.  Psychiatric: Normal judgment and insight. Alert and oriented x 3. Normal mood.   Labs on Admission: I have personally reviewed following labs and imaging studies  CBC: Recent Labs  Lab 01/29/19 1142  WBC 8.9  NEUTROABS 6.3  HGB 13.1  HCT 41.2  MCV 93.8  PLT 992   Basic Metabolic Panel: Recent Labs  Lab 01/29/19 1142  NA 135  K 3.4*  CL 96*  CO2 25  GLUCOSE 267*  BUN 37*  CREATININE 1.55*  CALCIUM 9.0   GFR: Estimated Creatinine Clearance: 27.6 mL/min (A) (by C-G formula based on SCr of 1.55 mg/dL (H)). Liver Function Tests: Recent Labs  Lab 01/29/19 1142  AST 19  ALT 11  ALKPHOS 80  BILITOT 0.6  PROT 7.1  ALBUMIN 3.1*  Recent Labs  Lab 01/29/19 1142  LIPASE 23   No results for input(s): AMMONIA in the last 168 hours. Coagulation Profile: Recent Labs  Lab 01/29/19 1355  INR 1.1   Cardiac Enzymes: No results for input(s): CKTOTAL, CKMB, CKMBINDEX, TROPONINI in the last 168 hours. BNP (last 3 results) No results for input(s): PROBNP in the last 8760 hours. HbA1C: No results for input(s): HGBA1C in the last 72 hours. CBG: No results for input(s): GLUCAP in the last 168 hours. Lipid Profile: No results for input(s): CHOL, HDL, LDLCALC, TRIG, CHOLHDL, LDLDIRECT in the last 72 hours. Thyroid Function Tests: No results for input(s): TSH, T4TOTAL, FREET4, T3FREE, THYROIDAB in the last 72 hours. Anemia Panel: No results for input(s): VITAMINB12, FOLATE, FERRITIN, TIBC, IRON, RETICCTPCT in the last 72 hours. Urine analysis: No results found for: COLORURINE, APPEARANCEUR, [place],  [place], GLUCOSEU, HGBUR, BILIRUBINUR, KETONESUR, PROTEINUR, UROBILINOGEN, NITRITE, LEUKOCYTESUR  Radiological Exams on Admission: Ct Abdomen Pelvis Wo Contrast  Result Date: 01/29/2019 CLINICAL DATA:  Abdominal pain, nausea and vomiting. EXAM: CT ABDOMEN AND PELVIS WITHOUT CONTRAST TECHNIQUE: Multidetector CT imaging of the abdomen and pelvis was performed following the standard protocol without IV contrast. COMPARISON:  None. FINDINGS: Lower chest: The lung bases are clear of an acute process. No pleural effusions. The heart is normal in size. No pericardial effusion. Distal esophagus is grossly normal. Hepatobiliary: No focal hepatic lesions or intrahepatic biliary dilatation. The gallbladder appears normal. No common bile duct dilatation. Pancreas: Largely fatty replaced pancreas. Fairly extensive peripancreatic inflammatory process but the patient's lipases normal and this is unlikely acute pancreatitis given that fact. Spleen: Normal size.  No focal lesions. Adrenals/Urinary Tract: The adrenal glands and kidneys are unremarkable. No renal, ureteral or bladder calculi or obvious mass without contrast. Stomach/Bowel: There is a calcified lesion projecting off the posterior aspect of the gastric fundus. This measures 2.8 cm. It could be a calcified leiomyoma or possibly a gastric diverticulum with chronic debris. Moderate inflammatory changes around the second and third portions of the duodenum along with extensive interstitial changes in the lesser sac, anterior pararenal spaces, upper retroperitoneum and peripancreatic space. This is likely related to duodenitis. I do not see any evidence of free air to suggest a perforated gastric ulcer or duodenal ulcer. The small bowel and colon are grossly normal without oral contrast. The terminal ileum and appendix are normal. Sigmoid colon diverticulosis without findings for acute diverticulitis. Vascular/Lymphatic: Moderate to advanced atherosclerotic calcifications  involving the aorta and branch vessels but no aneurysm. No mesenteric or retroperitoneal mass or adenopathy. Reproductive: The uterus and ovaries are grossly normal. Small cyst associated with the right ovary. Other: Small amount of free pelvic fluid but no pelvic mass or pelvic adenopathy. No inguinal mass or adenopathy. Musculoskeletal: Severe degenerative changes involving both hips, right greater than left with a moderate-sized right hip joint effusion and probable synovitis. Degenerative lumbar spondylosis with multilevel disc disease and facet disease. IMPRESSION: 1. Fairly extensive epigastric inflammatory process as detailed above. Although this could be pancreatitis the patient's lipase level was normal making this unlikely. This is probably secondary to duodenitis without definite ulcer or perforation. 2. 2.8 cm lesion near the gastric fundus could be a diverticulum or partially calcified leiomyoma. 3. No renal, ureteral or bladder calculi. 4. No CT findings suspicious for acute cholecystitis. 5. Age related vascular calcifications. 6. Severe right hip joint degenerative changes and moderate right hip joint effusion. Electronically Signed   By: Marijo Sanes M.D.   On: 01/29/2019  13:17   Dg Chest 2 View  Result Date: 01/29/2019 CLINICAL DATA:  Nausea, vomiting. EXAM: CHEST - 2 VIEW COMPARISON:  Radiographs of August 11, 2013. FINDINGS: The heart size and mediastinal contours are within normal limits. Both lungs are clear. The visualized skeletal structures are unremarkable. IMPRESSION: No active cardiopulmonary disease. Aortic Atherosclerosis (ICD10-I70.0). Electronically Signed   By: Marijo Conception M.D.   On: 01/29/2019 12:56    EKG: Independently reviewed. Vent. rate 96 BPM PR interval * ms QRS duration 108 ms QT/QTc 384/486 ms P-R-T axes 62 -45 165 Sinus rhythm Ventricular premature complex Left anterior fascicular block Left ventricular hypertrophy Nonspecific ST and T wave  abnormality  Assessment/Plan Principal Problem:   Elevated troponin I level Observation/stepdown. Supplemental oxygen as needed. Continue heparin infusion. Trend troponin level. Echo showing combined systolic/diastolic CHF. Resume diuretics in the morning if possible. Add low-dose ACE inhibitor. Consult cardiology in the morning.  Active Problems:   Acute gastroenteritis Has only had nausea and heartburn. Has been getting on and off heartburn for years. Continue Protonix 40 mg IVP every 12 hours. Advised to get a referral to see GI for EGD in the future.    Elevated serum creatinine  No known baseline. Follow-up level in the morning.    Hypokalemia Replace. Follow-up potassium level in a.m.    Type 2 diabetes mellitus ([facility]) Carbohydrate modified diet. Continue Amaryl 1 mg p.o. twice daily. CBG monitoring with regular insulin sliding scale. Check hemoglobin A1c in the morning.    Mixed hyperlipidemia Continue pravastatin 40 mg p.o. daily.    Essential hypertension Continue Cardizem 60 mg p.o. twice daily. Call furosemide and hydrochlorothiazide. Follow-up BP, heart rate, renal function electrolytes.    Glaucoma Continue Xalatan and Cosopt drops.    DVT prophylaxis: On heparin infusion. Code Status: Full code. Family Communication:  Disposition Plan: Observation for troponin level trending, echocardiogram, cardiology is following. Consults called: Dr. Rozann Lesches, MD (cardiology). Admission status: Observation/stepdown.   Reubin Milan MD [facility]  01/29/2019, 3:54 PM   This document was prepared using Dragon voice recognition software and may contain some unintended transcription errors.

## 2019-01-29 NOTE — ED Notes (Signed)
Pt advised a urine sample is needed, pt stated she still does not need to go at this time.

## 2019-01-29 NOTE — Progress Notes (Signed)
*  PRELIMINARY RESULTS* Echocardiogram 2D Echocardiogram has been performed.  Leavy Cella 01/29/2019, 3:01 PM

## 2019-01-29 NOTE — ED Notes (Signed)
CRITICAL VALUE ALERT  Critical Value:  Troponin 1,083  Date & Time Notied:  01/29/19 1228  Provider Notified: Dr. Thurnell Garbe  Orders Received/Actions taken: EDP notified

## 2019-01-30 DIAGNOSIS — R110 Nausea: Secondary | ICD-10-CM | POA: Diagnosis present

## 2019-01-30 DIAGNOSIS — K529 Noninfective gastroenteritis and colitis, unspecified: Secondary | ICD-10-CM | POA: Diagnosis present

## 2019-01-30 DIAGNOSIS — N179 Acute kidney failure, unspecified: Secondary | ICD-10-CM | POA: Diagnosis not present

## 2019-01-30 DIAGNOSIS — E7800 Pure hypercholesterolemia, unspecified: Secondary | ICD-10-CM | POA: Diagnosis present

## 2019-01-30 DIAGNOSIS — Z1159 Encounter for screening for other viral diseases: Secondary | ICD-10-CM | POA: Diagnosis not present

## 2019-01-30 DIAGNOSIS — R7989 Other specified abnormal findings of blood chemistry: Secondary | ICD-10-CM | POA: Diagnosis present

## 2019-01-30 DIAGNOSIS — Z7982 Long term (current) use of aspirin: Secondary | ICD-10-CM | POA: Diagnosis not present

## 2019-01-30 DIAGNOSIS — E8809 Other disorders of plasma-protein metabolism, not elsewhere classified: Secondary | ICD-10-CM | POA: Diagnosis present

## 2019-01-30 DIAGNOSIS — Z808 Family history of malignant neoplasm of other organs or systems: Secondary | ICD-10-CM | POA: Diagnosis not present

## 2019-01-30 DIAGNOSIS — E782 Mixed hyperlipidemia: Secondary | ICD-10-CM | POA: Diagnosis present

## 2019-01-30 DIAGNOSIS — N189 Chronic kidney disease, unspecified: Secondary | ICD-10-CM | POA: Diagnosis present

## 2019-01-30 DIAGNOSIS — E875 Hyperkalemia: Secondary | ICD-10-CM | POA: Diagnosis present

## 2019-01-30 DIAGNOSIS — R197 Diarrhea, unspecified: Secondary | ICD-10-CM | POA: Diagnosis not present

## 2019-01-30 DIAGNOSIS — I5020 Unspecified systolic (congestive) heart failure: Secondary | ICD-10-CM | POA: Diagnosis present

## 2019-01-30 DIAGNOSIS — K2980 Duodenitis without bleeding: Secondary | ICD-10-CM

## 2019-01-30 DIAGNOSIS — I255 Ischemic cardiomyopathy: Secondary | ICD-10-CM | POA: Diagnosis present

## 2019-01-30 DIAGNOSIS — I444 Left anterior fascicular block: Secondary | ICD-10-CM | POA: Diagnosis present

## 2019-01-30 DIAGNOSIS — Z79899 Other long term (current) drug therapy: Secondary | ICD-10-CM | POA: Diagnosis not present

## 2019-01-30 DIAGNOSIS — Z7984 Long term (current) use of oral hypoglycemic drugs: Secondary | ICD-10-CM | POA: Diagnosis not present

## 2019-01-30 DIAGNOSIS — Z833 Family history of diabetes mellitus: Secondary | ICD-10-CM | POA: Diagnosis not present

## 2019-01-30 DIAGNOSIS — H409 Unspecified glaucoma: Secondary | ICD-10-CM | POA: Diagnosis present

## 2019-01-30 DIAGNOSIS — R112 Nausea with vomiting, unspecified: Secondary | ICD-10-CM | POA: Diagnosis not present

## 2019-01-30 DIAGNOSIS — E876 Hypokalemia: Secondary | ICD-10-CM

## 2019-01-30 DIAGNOSIS — E1122 Type 2 diabetes mellitus with diabetic chronic kidney disease: Secondary | ICD-10-CM | POA: Diagnosis present

## 2019-01-30 DIAGNOSIS — I130 Hypertensive heart and chronic kidney disease with heart failure and stage 1 through stage 4 chronic kidney disease, or unspecified chronic kidney disease: Secondary | ICD-10-CM | POA: Diagnosis present

## 2019-01-30 DIAGNOSIS — R778 Other specified abnormalities of plasma proteins: Secondary | ICD-10-CM | POA: Diagnosis present

## 2019-01-30 DIAGNOSIS — M25451 Effusion, right hip: Secondary | ICD-10-CM | POA: Diagnosis present

## 2019-01-30 DIAGNOSIS — I252 Old myocardial infarction: Secondary | ICD-10-CM | POA: Diagnosis not present

## 2019-01-30 DIAGNOSIS — R1909 Other intra-abdominal and pelvic swelling, mass and lump: Secondary | ICD-10-CM | POA: Diagnosis present

## 2019-01-30 DIAGNOSIS — I700 Atherosclerosis of aorta: Secondary | ICD-10-CM | POA: Diagnosis present

## 2019-01-30 DIAGNOSIS — I10 Essential (primary) hypertension: Secondary | ICD-10-CM | POA: Diagnosis not present

## 2019-01-30 LAB — COMPREHENSIVE METABOLIC PANEL
ALT: 10 U/L (ref 0–44)
AST: 13 U/L — ABNORMAL LOW (ref 15–41)
Albumin: 2.6 g/dL — ABNORMAL LOW (ref 3.5–5.0)
Alkaline Phosphatase: 69 U/L (ref 38–126)
Anion gap: 16 — ABNORMAL HIGH (ref 5–15)
BUN: 44 mg/dL — ABNORMAL HIGH (ref 8–23)
CO2: 22 mmol/L (ref 22–32)
Calcium: 8.6 mg/dL — ABNORMAL LOW (ref 8.9–10.3)
Chloride: 97 mmol/L — ABNORMAL LOW (ref 98–111)
Creatinine, Ser: 1.65 mg/dL — ABNORMAL HIGH (ref 0.44–1.00)
GFR calc Af Amer: 33 mL/min — ABNORMAL LOW (ref >60)
GFR calc non Af Amer: 29 mL/min — ABNORMAL LOW (ref >60)
Glucose, Bld: 193 mg/dL — ABNORMAL HIGH (ref 70–99)
Potassium: 4.3 mmol/L (ref 3.5–5.1)
Sodium: 135 mmol/L (ref 135–145)
Total Bilirubin: 0.4 mg/dL (ref 0.3–1.2)
Total Protein: 6 g/dL — ABNORMAL LOW (ref 6.5–8.1)

## 2019-01-30 LAB — CBC
HCT: 37.3 % (ref 36.0–46.0)
Hemoglobin: 11.9 g/dL — ABNORMAL LOW (ref 12.0–15.0)
MCH: 30.2 pg (ref 26.0–34.0)
MCHC: 31.9 g/dL (ref 30.0–36.0)
MCV: 94.7 fL (ref 80.0–100.0)
Platelets: 220 10*3/uL (ref 150–400)
RBC: 3.94 MIL/uL (ref 3.87–5.11)
RDW: 12.4 % (ref 11.5–15.5)
WBC: 8.2 10*3/uL (ref 4.0–10.5)
nRBC: 0 % (ref 0.0–0.2)

## 2019-01-30 LAB — CK: Total CK: 79 U/L (ref 38–234)

## 2019-01-30 LAB — GLUCOSE, CAPILLARY
Glucose-Capillary: 156 mg/dL — ABNORMAL HIGH (ref 70–99)
Glucose-Capillary: 275 mg/dL — ABNORMAL HIGH (ref 70–99)
Glucose-Capillary: 208 mg/dL — ABNORMAL HIGH (ref 70–99)
Glucose-Capillary: 73 mg/dL (ref 70–99)

## 2019-01-30 LAB — HEMOGLOBIN A1C
Hgb A1c MFr Bld: 9.9 % — ABNORMAL HIGH (ref 4.8–5.6)
Mean Plasma Glucose: 237.43 mg/dL

## 2019-01-30 LAB — TROPONIN I (HIGH SENSITIVITY): Troponin I (High Sensitivity): 761 ng/L (ref <18)

## 2019-01-30 LAB — HEPARIN LEVEL (UNFRACTIONATED): Heparin Unfractionated: 0.39 IU/mL (ref 0.30–0.70)

## 2019-01-30 MED ORDER — CLOPIDOGREL BISULFATE 75 MG PO TABS
75.0000 mg | ORAL_TABLET | Freq: Every day | ORAL | Status: DC
  Administered 2019-01-30 – 2019-02-01 (×3): 75 mg via ORAL
  Filled 2019-01-30 (×3): qty 1

## 2019-01-30 MED ORDER — BISOPROLOL FUMARATE 5 MG PO TABS
2.5000 mg | ORAL_TABLET | Freq: Every day | ORAL | Status: DC
  Administered 2019-01-30 – 2019-02-01 (×3): 2.5 mg via ORAL
  Filled 2019-01-30 (×3): qty 1

## 2019-01-30 MED ORDER — POTASSIUM CHLORIDE IN NACL 20-0.9 MEQ/L-% IV SOLN
INTRAVENOUS | Status: DC
  Administered 2019-01-30 – 2019-01-31 (×2): via INTRAVENOUS

## 2019-01-30 MED ORDER — INSULIN ASPART 100 UNIT/ML ~~LOC~~ SOLN: 0.0000 [IU] | Freq: Every day | SUBCUTANEOUS | Status: DC

## 2019-01-30 MED ORDER — INSULIN ASPART 100 UNIT/ML ~~LOC~~ SOLN
0.0000 [IU] | Freq: Three times a day (TID) | SUBCUTANEOUS | Status: DC
  Administered 2019-01-30: 5 [IU] via SUBCUTANEOUS
  Administered 2019-01-31 – 2019-02-01 (×4): 2 [IU] via SUBCUTANEOUS

## 2019-01-30 NOTE — Consult Note (Addendum)
Referring Provider: Orson Eva, DO Primary Care Physician:  Lemmie Evens, MD Primary Gastroenterologist:  Dr. Laural Golden  Reason for Consultation:    Duodenitis  HPI:   Patient is 83 year old Caucasian female who woke up sick on the morning of 01/26/2019.  Patient was fine the day before.  She recalls that she had food from local restaurant the evening before.  Patient states she had few episodes of vomiting and nonbloody diarrhea.  She felt better by end of the day.  The next day she was able to tolerate liquids.  She just did not have a good appetite.  She also had nausea but no abdominal pain diarrhea melena or rectal bleeding.  Her daughter became concerned and brought her to emergency room for evaluation.  Evaluation in the emergency room revealed abnormal EKG and elevated troponin levels.  Abdominal pelvic CT revealed changes of duodenitis but no pneumoperitoneum or ascites.  Patient was admitted to ICU and cardiology consultation was obtained.  Dr. Domenic Polite felt that there was no urgent need for angiography.  Patient was maintained on low-dose aspirin and begun on clopidogrel and heparin infusion. Patient had echocardiogram which revealed EF of 35% and she also appears to have diastolic dysfunction. Review of the systems is negative for fever chills night sweats shortness of breath cough myalgias melena or rectal bleeding. Patient states she feels much better.  She is hungry.  She is hoping to try diabetic pain.  She says her a drop in her appetite 2 weeks ago but other symptoms started over the weekend.  She says she may have lost 3 to 5 pounds with acute symptoms.  She does not take OTC NSAIDs other than low-dose aspirin.  There is no history of peptic ulcer disease or pancreatitis.  She does not drink alcohol.  She also does not smoke cigarettes and never has in the past.    Past Medical History:  Diagnosis Date  . Essential hypertension   . Mixed hyperlipidemia   . Type 2 diabetes  mellitus ([place])         She was evaluated for iron deficiency anemia 5 years ago when she had EGD and colonoscopy.  EGD was normal and colonoscopy revealed sigmoid diverticulosis  Past Surgical History:  Procedure Laterality Date  . COLONOSCOPY N/A 11/26/2013   Procedure: COLONOSCOPY;  Surgeon: Rogene Houston, MD;  Location: [facility];  Service: Endoscopy;  Laterality: N/A;  915  . ESOPHAGOGASTRODUODENOSCOPY N/A 11/26/2013   Procedure: ESOPHAGOGASTRODUODENOSCOPY (EGD);  Surgeon: Rogene Houston, MD;  Location: [facility];  Service: Endoscopy;  Laterality: N/A;  . EYE SURGERY      Prior to Admission medications   Medication Sig Start Date End Date Taking? Authorizing Provider  aspirin 81 MG tablet Take 81 mg by mouth daily.   Yes [provider]  calcium carbonate (OS-CAL) 600 MG TABS tablet Take 1,200 mg by mouth daily.    Yes [provider]  cholecalciferol (VITAMIN D) 400 UNITS TABS tablet Take 1,000 Units by mouth.   Yes [provider]  diltiazem (CARDIZEM) 60 MG tablet Take 60 mg by mouth 2 (two) times daily.   Yes [provider]  dorzolamide-timolol (COSOPT) 22.3-6.8 MG/ML ophthalmic solution Place 1 drop into both eyes at bedtime. 12/25/18  Yes [provider]  ferrous sulfate (FERROUSUL) 325 (65 FE) MG tablet Take 1 tablet (325 mg total) by mouth daily with breakfast. 11/26/13  Yes Katheleen Stella, Mechele Dawley, MD  furosemide (LASIX) 20 MG tablet  Take 20 mg by mouth as needed for fluid.    Yes [provider]  hydrochlorothiazide (HYDRODIURIL) 25 MG tablet Take 25 mg by mouth daily.   Yes [provider]  latanoprost (XALATAN) 0.005 % ophthalmic solution Place 1 drop into both eyes at bedtime. 11/17/18  Yes [provider]  LORazepam (ATIVAN) 0.5 MG tablet Take 0.5 mg by mouth daily as needed for anxiety.    Yes [provider]  omeprazole (PRILOSEC) 20 MG capsule Take 20 mg by mouth daily.   Yes [provider]  pravastatin (PRAVACHOL) 40 MG tablet Take 40 mg by mouth daily.   Yes [provider]  glimepiride (AMARYL) 1 MG tablet Take 1 tablet by mouth 2 (two) times a day. 10/25/18   [provider]    Current Facility-Administered Medications  Medication Dose Route Frequency Provider Last Rate Last Dose  . 0.9 % NaCl with KCl 20 mEq/ L  infusion   Intravenous Continuous Tat, Shanon Brow, MD 75 mL/hr at 01/30/19 0949    . acetaminophen (TYLENOL) tablet 650 mg  650 mg Oral Q6H PRN Reubin Milan, MD       Or  . acetaminophen (TYLENOL) suppository 650 mg  650 mg Rectal Q6H PRN Reubin Milan, MD      . aspirin EC tablet 81 mg  81 mg Oral Daily Reubin Milan, MD   81 mg at 01/30/19 0951  . bisoprolol (ZEBETA) tablet 2.5 mg  2.5 mg Oral Daily Satira Sark, MD   2.5 mg at 01/30/19 0950  . Chlorhexidine Gluconate Cloth 2 % PADS 6 each  6 each Topical Daily Reubin Milan, MD      . clopidogrel (PLAVIX) tablet 75 mg  75 mg Oral Daily Satira Sark, MD   75 mg at 01/30/19 0951  . dorzolamide-timolol (COSOPT) 22.3-6.8 MG/ML ophthalmic solution 1 drop  1 drop Both Eyes QHS Reubin Milan, MD   1 drop at 01/29/19 2146  . heparin ADULT infusion 100 units/mL (25000 units/294mL sodium chloride 0.45%)  800 Units/hr Intravenous Continuous Reubin Milan, MD 8 mL/hr at 01/29/19 1359 800 Units/hr at 01/29/19 1359  . insulin aspart (novoLOG) injection 0-5 Units  0-5 Units Subcutaneous QHS Tat, David, MD      . insulin aspart (novoLOG) injection 0-9 Units  0-9 Units Subcutaneous TID WC Tat, David, MD      . latanoprost (XALATAN) 0.005 % ophthalmic solution 1 drop  1 drop Both Eyes QHS Reubin Milan, MD   1 drop at 01/29/19 2147  . LORazepam (ATIVAN) tablet 0.5 mg  0.5 mg Oral Daily PRN Reubin Milan, MD   0.5 mg at 01/29/19 2152  . pantoprazole (PROTONIX) injection 40 mg  40 mg Intravenous Q12H Reubin Milan, MD   40 mg at 01/30/19 0951   . pravastatin (PRAVACHOL) tablet 40 mg  40 mg Oral Daily Reubin Milan, MD   40 mg at 01/30/19 0951  . prochlorperazine (COMPAZINE) injection 5 mg  5 mg Intravenous Q4H PRN Reubin Milan, MD        Allergies as of 01/29/2019  . (No Known Allergies)    Family History  Problem Relation Age of Onset  . Diabetes Mellitus II Mother   . Brain cancer Father     Social History   Socioeconomic History  . Marital status: Married    Spouse name: Not on file  . Number of children: Not on file  .  Years of education: Not on file  . Highest education level: Not on file  Occupational History  . Not on file  Social Needs  . Financial resource strain: Not on file  . Food insecurity    Worry: Not on file    Inability: Not on file  . Transportation needs    Medical: Not on file    Non-medical: Not on file  Tobacco Use  . Smoking status: Never Smoker  . Smokeless tobacco: Never Used  Substance and Sexual Activity  . Alcohol use: No  . Drug use: No  . Sexual activity: Not on file  Lifestyle  . Physical activity    Days per week: Not on file    Minutes per session: Not on file  . Stress: Not on file  Relationships  . Social Herbalist on phone: Not on file    Gets together: Not on file    Attends religious service: Not on file    Active member of club or organization: Not on file    Attends meetings of clubs or organizations: Not on file    Relationship status: Not on file  . Intimate partner violence    Fear of current or ex partner: Not on file    Emotionally abused: Not on file    Physically abused: Not on file    Forced sexual activity: Not on file  Other Topics Concern  . Not on file  Social History Narrative  . Not on file    Review of Systems: See HPI, otherwise normal ROS  Physical Exam: Temp:  [97.8 F (36.6 C)-98.4 F (36.9 C)] 98.2 F (36.8 C) (07/02 0400) Pulse Rate:  [58-100] 71 (07/02 1000) Resp:  [13-28] 21 (07/02 1000) BP:  (88-142)/(49-96) 102/60 (07/02 0900) SpO2:  [87 %-100 %] 98 % (07/02 1000) Weight:  [76.5 kg-76.9 kg] 76.9 kg (07/02 0217) Last BM Date: 01/28/19  Patient is alert and in no acute distress. Conjunctiva is pink.  Sclerae nonicteric. Oropharyngeal mucosa is normal.  She is edentulous.  She has dentures that she is not using at the present time Neck without thyromegaly or lymphadenopathy. Cardiac exam with regular rhythm normal S1 and S2.  No murmur or gallop noted. Auscultation lungs reveal vesicular breath sounds bilaterally. Abdomen is full.  Bowel sounds are normal.  No bruit noted.  On palpation abdomen is soft with mild tenderness in mid epigastrium on deep palpation.  No organomegaly or masses. No clubbing noted. She has trace edema to ankles.   Lab Results: Recent Labs    01/29/19 1142 01/30/19 0516  WBC 8.9 8.2  HGB 13.1 11.9*  HCT 41.2 37.3  PLT 229 220   BMET Recent Labs    01/29/19 1142 01/30/19 0516  NA 135 135  K 3.4* 4.3  CL 96* 97*  CO2 25 22  GLUCOSE 267* 193*  BUN 37* 44*  CREATININE 1.55* 1.65*  CALCIUM 9.0 8.6*   LFT Recent Labs    01/30/19 0516  PROT 6.0*  ALBUMIN 2.6*  AST 13*  ALT 10  ALKPHOS 69  BILITOT 0.4   PT/INR Recent Labs    01/29/19 1355  LABPROT 14.0  INR 1.1    Studies/Results: Ct Abdomen Pelvis Wo Contrast  Result Date: 01/29/2019 CLINICAL DATA:  Abdominal pain, nausea and vomiting. EXAM: CT ABDOMEN AND PELVIS WITHOUT CONTRAST TECHNIQUE: Multidetector CT imaging of the abdomen and pelvis was performed following the standard protocol without IV contrast. COMPARISON:  None.  FINDINGS: Lower chest: The lung bases are clear of an acute process. No pleural effusions. The heart is normal in size. No pericardial effusion. Distal esophagus is grossly normal. Hepatobiliary: No focal hepatic lesions or intrahepatic biliary dilatation. The gallbladder appears normal. No common bile duct dilatation. Pancreas: Largely fatty replaced  pancreas. Fairly extensive peripancreatic inflammatory process but the patient's lipases normal and this is unlikely acute pancreatitis given that fact. Spleen: Normal size.  No focal lesions. Adrenals/Urinary Tract: The adrenal glands and kidneys are unremarkable. No renal, ureteral or bladder calculi or obvious mass without contrast. Stomach/Bowel: There is a calcified lesion projecting off the posterior aspect of the gastric fundus. This measures 2.8 cm. It could be a calcified leiomyoma or possibly a gastric diverticulum with chronic debris. Moderate inflammatory changes around the second and third portions of the duodenum along with extensive interstitial changes in the lesser sac, anterior pararenal spaces, upper retroperitoneum and peripancreatic space. This is likely related to duodenitis. I do not see any evidence of free air to suggest a perforated gastric ulcer or duodenal ulcer. The small bowel and colon are grossly normal without oral contrast. The terminal ileum and appendix are normal. Sigmoid colon diverticulosis without findings for acute diverticulitis. Vascular/Lymphatic: Moderate to advanced atherosclerotic calcifications involving the aorta and branch vessels but no aneurysm. No mesenteric or retroperitoneal mass or adenopathy. Reproductive: The uterus and ovaries are grossly normal. Small cyst associated with the right ovary. Other: Small amount of free pelvic fluid but no pelvic mass or pelvic adenopathy. No inguinal mass or adenopathy. Musculoskeletal: Severe degenerative changes involving both hips, right greater than left with a moderate-sized right hip joint effusion and probable synovitis. Degenerative lumbar spondylosis with multilevel disc disease and facet disease. IMPRESSION: 1. Fairly extensive epigastric inflammatory process as detailed above. Although this could be pancreatitis the patient's lipase level was normal making this unlikely. This is probably secondary to duodenitis  without definite ulcer or perforation. 2. 2.8 cm lesion near the gastric fundus could be a diverticulum or partially calcified leiomyoma. 3. No renal, ureteral or bladder calculi. 4. No CT findings suspicious for acute cholecystitis. 5. Age related vascular calcifications. 6. Severe right hip joint degenerative changes and moderate right hip joint effusion. Electronically Signed   By: Marijo Sanes M.D.   On: 01/29/2019 13:17   Dg Chest 2 View  Result Date: 01/29/2019 CLINICAL DATA:  Nausea, vomiting. EXAM: CHEST - 2 VIEW COMPARISON:  Radiographs of August 11, 2013. FINDINGS: The heart size and mediastinal contours are within normal limits. Both lungs are clear. The visualized skeletal structures are unremarkable. IMPRESSION: No active cardiopulmonary disease. Aortic Atherosclerosis (ICD10-I70.0). Electronically Signed   By: Marijo Conception M.D.   On: 01/29/2019 12:56   I have reviewed abdominal pelvic CT with Dr. Thornton Papas. I have also reviewed chest film.  Assessment;  #1.  Patient is 83 year old Caucasian female who presents with acute onset of nausea vomiting and nonbloody diarrhea and found to have changes of duodenitis involving third part of the duodenum.  Symptoms started about 12 hours after she ate food from a [employer]. Patient's symptoms are suggestive of food borne illness; however she could also have ischemic injury given she has extensive calcification to aorta as well as to takeoff of celiac trunk and SMA. Since patient is symptomatically improving and there is also concern for acute myocardial injury there is no urgent need for endoscopic evaluation. Since she is feeling better I would hold off empiric antibiotic therapy.  If she has diarrhea during hospitalization stool studies should be performed. She would also fit from CTA abdomen and pelvis when renal function improves.  #2.  Finding of partially calcified 2.8 cm mass close to gastric fundus most likely is a leiomyoma and  appears to be incidental finding.  I doubt that she has gastric diverticulum.  EGD about 5 years ago revealed no abnormality to the stomach.  Would consider follow-up CT in 6 months or so.  #3. CT reveals fatty replacement of pancreas which is a normal variation and no further work-up needed.  #4. Elevated troponin levels.  Patient is being evaluated by [facility].  There is concern for myocardial injury.  Echo reveals low EF.  Evaluation is ongoing.  #5.  Hypoalbuminemia.  Nothing suggest chronic liver disease.  Low albumin may be due to acute illness.  Will monitor.   Recommendations;  Diet advanced to full liquids. Will obtain stool sample for GI pathogen panel if she has loose stool. CTA abdomen and pelvis when renal function allows. Esophagogastroduodenoscopy when patient deemed to be medically stable. Will continue pantoprazole IV at current dose for the next 24 to 48 hours.   LOS: 0 days   Caitlin Reed  01/30/2019, 2:49 PM

## 2019-01-30 NOTE — Progress Notes (Signed)
PROGRESS NOTE  Caitlin Reed MCN:[id number] DOB: 30-Jan-1936 DOA: 01/29/2019 PCP: Lemmie Evens, MD  Brief History:  83 year old female with a history of hypertension, hyperlipidemia, diabetes mellitus presenting with nonspecific symptoms including fatigue, general malaise, nausea and decreased oral intake that began on 01/25/2019.  Upon further questioning, the patient actually stated that her nonspecific symptoms actually began slowly over 1 week ago worsening over the past weekend around 01/25/2019.  The patient had one episode of emesis and subsequently felt increasing generalized weakness and malaise.  She had denied any chest discomfort, dyspnea on exertion, dizziness, syncope.  She has not had any fevers, chills, chest pain, headache, sore throat, diarrhea, dysuria, hematuria, hematochezia, melena.  She stated that she took 3 Alka-Seltzer tablets which made her feel better temporarily, but her symptoms would recur.  She has not had recent travels or sick contacts.  She has not had any new changes to any of her medications.  Upon presentation, the patient was afebrile hemodynamically stable saturating 94-95% on room air.  BMP showed a serum creatinine of 1.55.  Her LFTs and lipase were unremarkable.  CBC was unremarkable with WBC 8.9, hemoglobin 11.9, platelets 220,000.  Urinalysis denies have significant pyuria.  CT of the abdomen and pelvis showed peripancreatic inflammation and moderate inflammatory changes to the second and third portion of the duodenum with extensive interstitial changes in the upper retroperitoneum.  The patient had an elevated highly sensitive troponin 1083>>> 934>>> 845.  Cardiology was consulted to assist with management.  Heparin drip was started.  Assessment/Plan: Acute gastroenteritis/duodenitis -Etiology unclear -Start IV fluids -Clear liquid diet if not anticipated cardiac procedures -GI consult -Continue Protonix twice daily -SARS-CoV2--negative   Elevated troponin -Appreciate cardiology consult -Patient has no symptoms of chest discomfort or exertional dyspnea -Continue IV heparin -further workup per cardiology -personally reviewed EKG--ST elevation V1-V3 without reciprocal changes, nonspecific T wave changes  Cardiomyopathy -01/29/2019 echo EF 35%, AK apical inferior wall, anterior septal, anterior wall, RVSP 47.6, mild TR -Discontinue diltiazem -start BB if ok with cardiology  Renal failure, unknown chronicity -Patient presented with serum creatinine 1.55 -Urine protein creatinine ratio -Urine sodium -Urine creatinine  Abnormal CT abdomen pelvis -01/29/2019 CT abdomen/pelvis--peripancreatic inflammation--> lipase is normal--this likely represents inflammatory changes from the patient's duodenitis -GI consult -May need endoscopy  Essential hypertension -Holding HCTZ and diltiazem  Diabetes mellitus type 2 -Holding glimepiride -Hemoglobin A1c -NovoLog sliding scale  Mixed hyperlipidemia -Continue pravastatin -Morning lipid panel  Hypokalemia -replete -mag 1.9     Disposition Plan:   Home in 1-2 days  Family Communication:  Daughter updated 7/2  Consultants:  GI, cardiology  Code Status:  FULL   DVT Prophylaxis:  IV Heparin    Procedures: As Listed in Progress Note Above  Antibiotics: None       Subjective: Patient still has some nausea but is feeling a little better than the last few days.  She denies any fevers, chills, headache, chest pain, shortness breath, vomiting, abdominal pain, dysuria, hematuria, diarrhea, hematochezia, melena.  Objective: Vitals:   01/30/19 0300 01/30/19 0400 01/30/19 0500 01/30/19 0600  BP: (!) 100/53 101/87 94/62 109/76  Pulse: (!) 59 84 62 67  Resp: 18 17 18 18   Temp:  98.2 F (36.8 C)    TempSrc:  Oral    SpO2: 93% (!) 87% 93% 91%  Weight:      Height:        Intake/Output Summary (Last 24 hours)  at 01/30/2019 0704 Last data filed at 01/30/2019 0100  Gross per 24 hour  Intake 544.09 ml  Output 100 ml  Net 444.09 ml   Weight change:  Exam:   General:  Pt is alert, follows commands appropriately, not in acute distress  HEENT: No icterus, No thrush, No neck mass, [place]/AT  Cardiovascular: RRR, S1/S2, no rubs, no gallops  Respiratory: Bibasilar crackles.  Left clear to auscultation.  No wheezing.  Abdomen: Soft/+BS, non tender, non distended, no guarding  Extremities: No edema, No lymphangitis, No petechiae, No rashes, no synovitis   Data Reviewed: I have personally reviewed following labs and imaging studies Basic Metabolic Panel: Recent Labs  Lab 01/29/19 1142 01/29/19 1544  NA 135  --   K 3.4*  --   CL 96*  --   CO2 25  --   GLUCOSE 267*  --   BUN 37*  --   CREATININE 1.55*  --   CALCIUM 9.0  --   MG  --  1.9  PHOS  --  2.9   Liver Function Tests: Recent Labs  Lab 01/29/19 1142  AST 19  ALT 11  ALKPHOS 80  BILITOT 0.6  PROT 7.1  ALBUMIN 3.1*   Recent Labs  Lab 01/29/19 1142  LIPASE 23   No results for input(s): AMMONIA in the last 168 hours. Coagulation Profile: Recent Labs  Lab 01/29/19 1355  INR 1.1   CBC: Recent Labs  Lab 01/29/19 1142 01/30/19 0516  WBC 8.9 8.2  NEUTROABS 6.3  --   HGB 13.1 11.9*  HCT 41.2 37.3  MCV 93.8 94.7  PLT 229 220   Cardiac Enzymes: No results for input(s): CKTOTAL, CKMB, CKMBINDEX, TROPONINI in the last 168 hours. BNP: Invalid input(s): POCBNP CBG: Recent Labs  Lab 01/29/19 1703 01/29/19 2156  GLUCAP 214* 174*   HbA1C: No results for input(s): HGBA1C in the last 72 hours. Urine analysis:    Component Value Date/Time   COLORURINE YELLOW 01/29/2019 2000   APPEARANCEUR CLEAR 01/29/2019 2000   LABSPEC 1.026 01/29/2019 2000   PHURINE 5.0 01/29/2019 2000   GLUCOSEU NEGATIVE 01/29/2019 2000   HGBUR NEGATIVE 01/29/2019 [place] NEGATIVE 01/29/2019 2000   KETONESUR 5 (A) 01/29/2019 2000   PROTEINUR 30 (A) 01/29/2019 2000   NITRITE  NEGATIVE 01/29/2019 2000   LEUKOCYTESUR NEGATIVE 01/29/2019 2000   Sepsis Labs: @LABRCNTIP (procalcitonin:4,lacticidven:4) ) Recent Results (from the past 240 hour(s))  SARS Coronavirus 2 (CEPHEID - Performed in [place] hospital lab), Hosp Order     Status: None   Collection Time: 01/29/19  1:17 PM   Specimen: Nasopharyngeal Swab  Result Value Ref Range Status   SARS Coronavirus 2 NEGATIVE NEGATIVE Final    Comment: (NOTE) If result is NEGATIVE SARS-CoV-2 target nucleic acids are NOT DETECTED. The SARS-CoV-2 RNA is generally detectable in upper and lower  respiratory specimens during the acute phase of infection. The lowest  concentration of SARS-CoV-2 viral copies this assay can detect is 250  copies / mL. A negative result does not preclude SARS-CoV-2 infection  and should not be used as the sole basis for treatment or other  patient management decisions.  A negative result may occur with  improper specimen collection / handling, submission of specimen other  than nasopharyngeal swab, presence of viral mutation(s) within the  areas targeted by this assay, and inadequate number of viral copies  (<250 copies / mL). A negative result must be combined with clinical  observations, patient history,  and epidemiological information. If result is POSITIVE SARS-CoV-2 target nucleic acids are DETECTED. The SARS-CoV-2 RNA is generally detectable in upper and lower  respiratory specimens dur ing the acute phase of infection.  Positive  results are indicative of active infection with SARS-CoV-2.  Clinical  correlation with patient history and other diagnostic information is  necessary to determine patient infection status.  Positive results do  not rule out bacterial infection or co-infection with other viruses. If result is PRESUMPTIVE POSTIVE SARS-CoV-2 nucleic acids MAY BE PRESENT.   A presumptive positive result was obtained on the submitted specimen  and confirmed on repeat testing.   While 2019 novel coronavirus  (SARS-CoV-2) nucleic acids may be present in the submitted sample  additional confirmatory testing may be necessary for epidemiological  and / or clinical management purposes  to differentiate between  SARS-CoV-2 and other Sarbecovirus currently known to infect humans.  If clinically indicated additional testing with an alternate test  methodology [phone]) is advised. The SARS-CoV-2 RNA is generally  detectable in upper and lower respiratory sp ecimens during the acute  phase of infection. The expected result is Negative. Fact Sheet for Patients:  StrictlyIdeas.no Fact Sheet for Healthcare Providers: BankingDealers.co.za This test is not yet approved or cleared by the [place] FDA and has been authorized for detection and/or diagnosis of SARS-CoV-2 by FDA under an Emergency Use Authorization (EUA).  This EUA will remain in effect (meaning this test can be used) for the duration of the COVID-19 declaration under Section 564(b)(1) of the Act, 21 U.S.C. section 360bbb-3(b)(1), unless the authorization is terminated or revoked sooner. Performed at [facility], [address] [place], [place] [address]   MRSA PCR Screening     Status: None   Collection Time: 01/29/19  4:45 PM   Specimen: Nasopharyngeal  Result Value Ref Range Status   MRSA by PCR NEGATIVE NEGATIVE Final    Comment:        The GeneXpert MRSA Assay (FDA approved for NASAL specimens only), is one component of a comprehensive MRSA colonization surveillance program. It is not intended to diagnose MRSA infection nor to guide or monitor treatment for MRSA infections. Performed at [facility], [address], [place], [place] [address]      Scheduled Meds: . aspirin EC  81 mg Oral Daily  . Chlorhexidine Gluconate Cloth  6 each Topical Daily  . diltiazem  60 mg Oral BID  . dorzolamide-timolol  1 drop Both Eyes QHS  . insulin aspart   0-9 Units Subcutaneous TID WC  . latanoprost  1 drop Both Eyes QHS  . pantoprazole (PROTONIX) IV  40 mg Intravenous Q12H  . pravastatin  40 mg Oral Daily   Continuous Infusions: . heparin 800 Units/hr (01/29/19 1359)    Procedures/Studies: Ct Abdomen Pelvis Wo Contrast  Result Date: 01/29/2019 CLINICAL DATA:  Abdominal pain, nausea and vomiting. EXAM: CT ABDOMEN AND PELVIS WITHOUT CONTRAST TECHNIQUE: Multidetector CT imaging of the abdomen and pelvis was performed following the standard protocol without IV contrast. COMPARISON:  None. FINDINGS: Lower chest: The lung bases are clear of an acute process. No pleural effusions. The heart is normal in size. No pericardial effusion. Distal esophagus is grossly normal. Hepatobiliary: No focal hepatic lesions or intrahepatic biliary dilatation. The gallbladder appears normal. No common bile duct dilatation. Pancreas: Largely fatty replaced pancreas. Fairly extensive peripancreatic inflammatory process but the patient's lipases normal and this is unlikely acute pancreatitis given that fact. Spleen: Normal size.  No focal lesions.  Adrenals/Urinary Tract: The adrenal glands and kidneys are unremarkable. No renal, ureteral or bladder calculi or obvious mass without contrast. Stomach/Bowel: There is a calcified lesion projecting off the posterior aspect of the gastric fundus. This measures 2.8 cm. It could be a calcified leiomyoma or possibly a gastric diverticulum with chronic debris. Moderate inflammatory changes around the second and third portions of the duodenum along with extensive interstitial changes in the lesser sac, anterior pararenal spaces, upper retroperitoneum and peripancreatic space. This is likely related to duodenitis. I do not see any evidence of free air to suggest a perforated gastric ulcer or duodenal ulcer. The small bowel and colon are grossly normal without oral contrast. The terminal ileum and appendix are normal. Sigmoid colon  diverticulosis without findings for acute diverticulitis. Vascular/Lymphatic: Moderate to advanced atherosclerotic calcifications involving the aorta and branch vessels but no aneurysm. No mesenteric or retroperitoneal mass or adenopathy. Reproductive: The uterus and ovaries are grossly normal. Small cyst associated with the right ovary. Other: Small amount of free pelvic fluid but no pelvic mass or pelvic adenopathy. No inguinal mass or adenopathy. Musculoskeletal: Severe degenerative changes involving both hips, right greater than left with a moderate-sized right hip joint effusion and probable synovitis. Degenerative lumbar spondylosis with multilevel disc disease and facet disease. IMPRESSION: 1. Fairly extensive epigastric inflammatory process as detailed above. Although this could be pancreatitis the patient's lipase level was normal making this unlikely. This is probably secondary to duodenitis without definite ulcer or perforation. 2. 2.8 cm lesion near the gastric fundus could be a diverticulum or partially calcified leiomyoma. 3. No renal, ureteral or bladder calculi. 4. No CT findings suspicious for acute cholecystitis. 5. Age related vascular calcifications. 6. Severe right hip joint degenerative changes and moderate right hip joint effusion. Electronically Signed   By: Marijo Sanes M.D.   On: 01/29/2019 13:17   Dg Chest 2 View  Result Date: 01/29/2019 CLINICAL DATA:  Nausea, vomiting. EXAM: CHEST - 2 VIEW COMPARISON:  Radiographs of August 11, 2013. FINDINGS: The heart size and mediastinal contours are within normal limits. Both lungs are clear. The visualized skeletal structures are unremarkable. IMPRESSION: No active cardiopulmonary disease. Aortic Atherosclerosis (ICD10-I70.0). Electronically Signed   By: Marijo Conception M.D.   On: 01/29/2019 12:56    Orson Eva, DO  [facility] Pager [phone]  If 7PM-7AM, please contact night-coverage [email or web address] Password [email or web address] 01/30/2019,  7:04 AM   LOS: 0 days

## 2019-01-30 NOTE — Progress Notes (Signed)
Pt given orange juice for blood sugar 73. Will continue to monitor pt and recheck blood sugar at 0300.

## 2019-01-30 NOTE — Progress Notes (Signed)
Progress Note  Patient Name: Caitlin Reed Date of Encounter: 01/30/2019  Primary Cardiologist: Satira Sark, MD  Subjective   No chest pain or shortness of breath, no abdominal pain, nausea, or emesis.  Has not eaten breakfast yet.  No orthopnea or PND.  Inpatient Medications    Scheduled Meds:  aspirin EC  81 mg Oral Daily   bisoprolol  2.5 mg Oral Daily   Chlorhexidine Gluconate Cloth  6 each Topical Daily   clopidogrel  75 mg Oral Daily   dorzolamide-timolol  1 drop Both Eyes QHS   insulin aspart  0-9 Units Subcutaneous TID WC   latanoprost  1 drop Both Eyes QHS   pantoprazole (PROTONIX) IV  40 mg Intravenous Q12H   pravastatin  40 mg Oral Daily   Continuous Infusions:  0.9 % NaCl with KCl 20 mEq / L     heparin 800 Units/hr (01/29/19 1359)   PRN Meds: acetaminophen **OR** acetaminophen, LORazepam, prochlorperazine   Vital Signs    Vitals:   01/30/19 0300 01/30/19 0400 01/30/19 0500 01/30/19 0600  BP: (!) 100/53 101/87 94/62 109/76  Pulse: (!) 59 84 62 67  Resp: 18 17 18 18   Temp:  98.2 F (36.8 C)    TempSrc:  Oral    SpO2: 93% (!) 87% 93% 91%  Weight:      Height:        Intake/Output Summary (Last 24 hours) at 01/30/2019 0826 Last data filed at 01/30/2019 0100 Gross per 24 hour  Intake 544.09 ml  Output 100 ml  Net 444.09 ml   Filed Weights   01/29/19 1300 01/29/19 1706 01/30/19 0217  Weight: 74.8 kg 76.5 kg 76.9 kg    Telemetry    Sinus rhythm.  Personally reviewed.  ECG    Tracing from 01/30/2019 shows sinus rhythm with anterior infarct pattern, Q waves across the precordium with residual ST segment changes.  Personally reviewed.  Physical Exam   GEN:  Elderly woman.  No acute distress.   Neck: No JVD. Cardiac: RRR, no gallop or rub.  Respiratory: Nonlabored. Clear to auscultation bilaterally. GI: Soft, nontender, bowel sounds present. MS: No edema; No deformity. Neuro:  Nonfocal. Psych: Alert and oriented x 3.  Normal affect.  Labs    Chemistry Recent Labs  Lab 01/29/19 1142 01/30/19 0516  NA 135 135  K 3.4* 4.3  CL 96* 97*  CO2 25 22  GLUCOSE 267* 193*  BUN 37* 44*  CREATININE 1.55* 1.65*  CALCIUM 9.0 8.6*  PROT 7.1 6.0*  ALBUMIN 3.1* 2.6*  AST 19 13*  ALT 11 10  ALKPHOS 80 69  BILITOT 0.6 0.4  GFRNONAA 31* 29*  GFRAA 36* 33*  ANIONGAP 14 16*     Hematology Recent Labs  Lab 01/29/19 1142 01/30/19 0516  WBC 8.9 8.2  RBC 4.39 3.94  HGB 13.1 11.9*  HCT 41.2 37.3  MCV 93.8 94.7  MCH 29.8 30.2  MCHC 31.8 31.9  RDW 12.5 12.4  PLT 229 220    BNP Recent Labs  Lab 01/29/19 1546  BNP 2,669.0*     Radiology    Ct Abdomen Pelvis Wo Contrast  Result Date: 01/29/2019 CLINICAL DATA:  Abdominal pain, nausea and vomiting. EXAM: CT ABDOMEN AND PELVIS WITHOUT CONTRAST TECHNIQUE: Multidetector CT imaging of the abdomen and pelvis was performed following the standard protocol without IV contrast. COMPARISON:  None. FINDINGS: Lower chest: The lung bases are clear of an acute process. No pleural effusions. The  heart is normal in size. No pericardial effusion. Distal esophagus is grossly normal. Hepatobiliary: No focal hepatic lesions or intrahepatic biliary dilatation. The gallbladder appears normal. No common bile duct dilatation. Pancreas: Largely fatty replaced pancreas. Fairly extensive peripancreatic inflammatory process but the patient's lipases normal and this is unlikely acute pancreatitis given that fact. Spleen: Normal size.  No focal lesions. Adrenals/Urinary Tract: The adrenal glands and kidneys are unremarkable. No renal, ureteral or bladder calculi or obvious mass without contrast. Stomach/Bowel: There is a calcified lesion projecting off the posterior aspect of the gastric fundus. This measures 2.8 cm. It could be a calcified leiomyoma or possibly a gastric diverticulum with chronic debris. Moderate inflammatory changes around the second and third portions of the duodenum  along with extensive interstitial changes in the lesser sac, anterior pararenal spaces, upper retroperitoneum and peripancreatic space. This is likely related to duodenitis. I do not see any evidence of free air to suggest a perforated gastric ulcer or duodenal ulcer. The small bowel and colon are grossly normal without oral contrast. The terminal ileum and appendix are normal. Sigmoid colon diverticulosis without findings for acute diverticulitis. Vascular/Lymphatic: Moderate to advanced atherosclerotic calcifications involving the aorta and branch vessels but no aneurysm. No mesenteric or retroperitoneal mass or adenopathy. Reproductive: The uterus and ovaries are grossly normal. Small cyst associated with the right ovary. Other: Small amount of free pelvic fluid but no pelvic mass or pelvic adenopathy. No inguinal mass or adenopathy. Musculoskeletal: Severe degenerative changes involving both hips, right greater than left with a moderate-sized right hip joint effusion and probable synovitis. Degenerative lumbar spondylosis with multilevel disc disease and facet disease. IMPRESSION: 1. Fairly extensive epigastric inflammatory process as detailed above. Although this could be pancreatitis the patient's lipase level was normal making this unlikely. This is probably secondary to duodenitis without definite ulcer or perforation. 2. 2.8 cm lesion near the gastric fundus could be a diverticulum or partially calcified leiomyoma. 3. No renal, ureteral or bladder calculi. 4. No CT findings suspicious for acute cholecystitis. 5. Age related vascular calcifications. 6. Severe right hip joint degenerative changes and moderate right hip joint effusion. Electronically Signed   By: Marijo Sanes M.D.   On: 01/29/2019 13:17   Dg Chest 2 View  Result Date: 01/29/2019 CLINICAL DATA:  Nausea, vomiting. EXAM: CHEST - 2 VIEW COMPARISON:  Radiographs of August 11, 2013. FINDINGS: The heart size and mediastinal contours are within  normal limits. Both lungs are clear. The visualized skeletal structures are unremarkable. IMPRESSION: No active cardiopulmonary disease. Aortic Atherosclerosis (ICD10-I70.0). Electronically Signed   By: Marijo Conception M.D.   On: 01/29/2019 12:56    Cardiac Studies   Echocardiogram 01/29/2019:  1. The left ventricle has a visually estimated ejection fraction of approximately 35%. The cavity size was normal. There is moderately increased left ventricular wall thickness. Left ventricular diastolic Doppler parameters are consistent with impaired  relaxation.  2. There is akinesis of the left ventricular, apical inferior wall.  3. There is akinesis of the left ventricular, mid-apical anteroseptal wall, anterior wall and apical segment.  4. The right ventricle has normal systolic function. The cavity was normal. There is no increase in right ventricular wall thickness. Right ventricular systolic pressure is moderately elevated with an estimated pressure of 47.6 mmHg.  5. The aortic valve is tricuspid. Mild aortic annular calcification noted.  6. The mitral valve is grossly normal. There is mild mitral annular calcification present.  7. The tricuspid valve is grossly normal.  8. The aortic root is normal in size and structure.  Patient Profile     83 y.o. female with a history of hypertension, type 2 diabetes mellitus, and hyperlipidemia presenting with recent nausea and anorexia, evidence of duodenitis and acute renal insufficiency, also newly documented cardiomyopathy with suggestion of recent anterior infarct.  Assessment & Plan    1.  Cardiomyopathy, presumably ischemic with LVEF approximately 35% and fairly large region of akinesis involving the anterior/anteroseptal/inferior apical, and apical regions.  No definite ballooning to suggest stress-induced cardiomyopathy.  High-sensitivity troponin trend has actually been decreasing and could still be in the range of demand ischemia which raises the  question of whether this is a recent ACS event or perhaps she has had a longer term cardiomyopathy that was undiagnosed.  Her ECG is however suggestive of a recent anterior distribution infarct.  She does not report any angina at this time and is otherwise hemodynamically stable.  2.  Duodenitis based on CT imaging, recent symptoms including nausea and anorexia.  She reports no active symptoms now.  Lipase and LFTs are normal.  She has some peripancreatic inflammation but do not necessarily suspect pancreatitis at this point.  GI consult is pending.  3.  Essential hypertension.  She is on Cardizem and HCTZ as an outpatient, both held at this time.  4.  Type 2 diabetes mellitus, she is currently on sliding scale NovoLog.  5.  Mixed hyperlipidemia, on Pravachol as an outpatient.  Lipid panel pending.  6.  Acute renal insufficiency, creatinine has increased to 1.65.  She is receiving gentle hydration.  HCTZ on hold.  Would continue aspirin and Pravachol, also starting Plavix.  She is on heparin, would anticipate a 48-hour treatment course.  Also starting low-dose bisoprolol 2.5 mg daily.  Ideally we would transition to an ARB or ANRI as well instead of resuming her outpatient Cardizem and HCTZ, although would hold off until renal function stabilizes.  In the absence of active chest pain would anticipate medical therapy at this point, no clear indication to push for cardiac catheterization at this time, particularly in light of her worsening renal function.  If she remains clinically stable, would optimize medical therapy and then consider her for a [place] (possibly as an outpatient) to clarify ischemic burden - if she has largely scar, would not pursue revascularization necessarily.  As far as further GI work-up, she should be able to proceed with an endoscopy if this is felt to be necessary.  Signed, Rozann Lesches, MD  01/30/2019, 8:26 AM

## 2019-01-30 NOTE — Progress Notes (Signed)
[place] for heparin Indication: ACS/STEMI  No Known Allergies  Patient Measurements: Height: 5\' 3"  (160 cm) Weight: 169 lb 8.5 oz (76.9 kg) IBW/kg (Calculated) : 52.4 Heparin Dosing Weight: 68.3 kg  Vital Signs: Temp: 98.2 F (36.8 C) (07/02 0400) Temp Source: Oral (07/02 0400) BP: 109/76 (07/02 0600) Pulse Rate: 67 (07/02 0600)  Labs: Recent Labs    01/29/19 1142 01/29/19 1355 01/29/19 1832 01/29/19 2220 01/30/19 0056 01/30/19 0516  HGB 13.1  --   --   --   --  11.9*  HCT 41.2  --   --   --   --  37.3  PLT 229  --   --   --   --  220  APTT  --  23*  --   --   --   --   LABPROT  --  14.0  --   --   --   --   INR  --  1.1  --   --   --   --   HEPARINUNFRC  --   --   --  0.46  --  0.39  CREATININE 1.55*  --   --   --   --  1.65*  CKTOTAL  --   --   --   --   --  79  TROPONINIHS 1,083.00* 934.00* 845.00*  --  761.00*  --     Estimated Creatinine Clearance: 26.3 mL/min (A) (by C-G formula based on SCr of 1.65 mg/dL (H)).  Assessment: 83 y.o. female with elevated cardiac markers for heparin   Goal of Therapy:  Heparin level 0.3-0.7 units/ml Monitor platelets by anticoagulation protocol: Yes   Plan:  Continue heparin infusion at 800 unit/hr Check heparin level daily while on heparin Continue to monitor H&H and platelets   Despina Pole, Pharm. D. Clinical Pharmacist 01/30/2019 9:06 AM

## 2019-01-30 NOTE — Care Management Obs Status (Signed)
[place] NOTIFICATION   Patient Details  Name: Caitlin Reed MRN: [id number] Date of Birth: Feb 08, 1936   Medicare Observation Status Notification Given:       Boneta Lucks, RN 01/30/2019, 3:03 PM

## 2019-01-30 NOTE — Progress Notes (Signed)
Pts alarm started beeping- Rn walked in room to find patient sitting on side of bed. Pt had pulled all of her leads,pulse ox, and was trying to get up to go to BR. RN tried to assist pt to BR but pt was to weak to get up. BSC contained and 2 assist to get people up . Pt answering all questions correctly but is confused as to what is going on. Urine specimen obtained, but on urinated 59mL. Will obtain bladder scan. Rn and NT put pt back to bed, bed alarm on, SR up. Call bell within reach, pt educated once again to call when she needs to urinate.  Will continue to monitor pt.

## 2019-01-30 NOTE — Progress Notes (Signed)
[place] for heparin Indication: ACS/STEMI  No Known Allergies  Patient Measurements: Height: 5\' 3"  (160 cm) Weight: 168 lb 10.4 oz (76.5 kg) IBW/kg (Calculated) : 52.4 Heparin Dosing Weight: 68.3 kg  Vital Signs: Temp: 98.4 F (36.9 C) (07/01 1700) Temp Source: Oral (07/01 1700) BP: 88/61 (07/01 2300) Pulse Rate: 72 (07/01 2300)  Labs: Recent Labs    01/29/19 1142 01/29/19 1355 01/29/19 1832 01/29/19 2220  HGB 13.1  --   --   --   HCT 41.2  --   --   --   PLT 229  --   --   --   APTT  --  23*  --   --   LABPROT  --  14.0  --   --   INR  --  1.1  --   --   HEPARINUNFRC  --   --   --  0.46  CREATININE 1.55*  --   --   --   TROPONINIHS 1,083.00* 934.00* 845.00*  --     Estimated Creatinine Clearance: 27.9 mL/min (A) (by C-G formula based on SCr of 1.55 mg/dL (H)).  Assessment: 83 y.o. female with elevated cardiac markers for heparin   Goal of Therapy:  Heparin level 0.3-0.7 units/ml Monitor platelets by anticoagulation protocol: Yes   Plan:  Continue Heparin at current rate Follow-up am labs.  Phillis Knack, PharmD, BCPS 01/30/2019 12:16 AM

## 2019-01-31 DIAGNOSIS — R7989 Other specified abnormal findings of blood chemistry: Secondary | ICD-10-CM

## 2019-01-31 LAB — BASIC METABOLIC PANEL
Anion gap: 10 (ref 5–15)
Anion gap: 9 (ref 5–15)
BUN: 47 mg/dL — ABNORMAL HIGH (ref 8–23)
BUN: 45 mg/dL — ABNORMAL HIGH (ref 8–23)
CO2: 25 mmol/L (ref 22–32)
CO2: 23 mmol/L (ref 22–32)
Calcium: 8.3 mg/dL — ABNORMAL LOW (ref 8.9–10.3)
Calcium: 8.1 mg/dL — ABNORMAL LOW (ref 8.9–10.3)
Chloride: 102 mmol/L (ref 98–111)
Chloride: 104 mmol/L (ref 98–111)
Creatinine, Ser: 1.94 mg/dL — ABNORMAL HIGH (ref 0.44–1.00)
Creatinine, Ser: 1.92 mg/dL — ABNORMAL HIGH (ref 0.44–1.00)
GFR calc Af Amer: 27 mL/min — ABNORMAL LOW (ref >60)
GFR calc Af Amer: 28 mL/min — ABNORMAL LOW (ref >60)
GFR calc non Af Amer: 24 mL/min — ABNORMAL LOW (ref >60)
GFR calc non Af Amer: 24 mL/min — ABNORMAL LOW (ref >60)
Glucose, Bld: 97 mg/dL (ref 70–99)
Glucose, Bld: 204 mg/dL — ABNORMAL HIGH (ref 70–99)
Potassium: 5.5 mmol/L — ABNORMAL HIGH (ref 3.5–5.1)
Potassium: 4.8 mmol/L (ref 3.5–5.1)
Sodium: 137 mmol/L (ref 135–145)
Sodium: 136 mmol/L (ref 135–145)

## 2019-01-31 LAB — LIPID PANEL
Cholesterol: 141 mg/dL (ref 0–200)
HDL: 37 mg/dL — ABNORMAL LOW (ref >40)
LDL Cholesterol: 79 mg/dL (ref 0–99)
Total CHOL/HDL Ratio: 3.8 RATIO
Triglycerides: 124 mg/dL (ref <150)
VLDL: 25 mg/dL (ref 0–40)

## 2019-01-31 LAB — CBC
HCT: 33.2 % — ABNORMAL LOW (ref 36.0–46.0)
Hemoglobin: 10.4 g/dL — ABNORMAL LOW (ref 12.0–15.0)
MCH: 30.3 pg (ref 26.0–34.0)
MCHC: 31.3 g/dL (ref 30.0–36.0)
MCV: 96.8 fL (ref 80.0–100.0)
Platelets: 180 10*3/uL (ref 150–400)
RBC: 3.43 MIL/uL — ABNORMAL LOW (ref 3.87–5.11)
RDW: 12.5 % (ref 11.5–15.5)
WBC: 7.5 10*3/uL (ref 4.0–10.5)
nRBC: 0 % (ref 0.0–0.2)

## 2019-01-31 LAB — PROTEIN / CREATININE RATIO, URINE
Creatinine, Urine: 295.68 mg/dL
Protein Creatinine Ratio: 0.06 mg/mg{Cre} (ref 0.00–0.15)
Total Protein, Urine: 18 mg/dL

## 2019-01-31 LAB — GLUCOSE, CAPILLARY
Glucose-Capillary: 89 mg/dL (ref 70–99)
Glucose-Capillary: 92 mg/dL (ref 70–99)
Glucose-Capillary: 161 mg/dL — ABNORMAL HIGH (ref 70–99)
Glucose-Capillary: 200 mg/dL — ABNORMAL HIGH (ref 70–99)
Glucose-Capillary: 175 mg/dL — ABNORMAL HIGH (ref 70–99)

## 2019-01-31 LAB — SODIUM, URINE, RANDOM: Sodium, Ur: 10 mmol/L

## 2019-01-31 LAB — URINE CULTURE: Culture: NO GROWTH

## 2019-01-31 LAB — MAGNESIUM: Magnesium: 2.3 mg/dL (ref 1.7–2.4)

## 2019-01-31 LAB — CREATININE, URINE, RANDOM: Creatinine, Urine: 289.95 mg/dL

## 2019-01-31 LAB — HEPARIN LEVEL (UNFRACTIONATED): Heparin Unfractionated: 0.53 IU/mL (ref 0.30–0.70)

## 2019-01-31 MED ORDER — SODIUM CHLORIDE 0.9 % IV SOLN
INTRAVENOUS | Status: DC
  Administered 2019-01-31: 10:00:00 via INTRAVENOUS

## 2019-01-31 MED ORDER — PANTOPRAZOLE SODIUM 40 MG PO TBEC
40.0000 mg | DELAYED_RELEASE_TABLET | Freq: Every day | ORAL | Status: DC
  Administered 2019-02-01: 40 mg via ORAL
  Filled 2019-01-31: qty 1

## 2019-01-31 MED ORDER — ATORVASTATIN CALCIUM 20 MG PO TABS
20.0000 mg | ORAL_TABLET | Freq: Every day | ORAL | Status: DC
  Administered 2019-01-31: 20 mg via ORAL
  Filled 2019-01-31: qty 1

## 2019-01-31 MED ORDER — TRAMADOL HCL 50 MG PO TABS
50.0000 mg | ORAL_TABLET | Freq: Once | ORAL | Status: AC
  Administered 2019-01-31: 50 mg via ORAL
  Filled 2019-01-31: qty 1

## 2019-01-31 NOTE — TOC Initial Note (Deleted)
Transition of Care [facility]) - Initial/Assessment Note    Patient Details  Name: Caitlin Reed MRN: [id number] Date of Birth: 1935/08/07  Transition of Care [facility]) CM/SW Contact:    Shade Flood, LCSW Phone Number: 01/31/2019, 11:27 AM  Clinical Narrative:                  Pt admitted from home. She is high readmission risk. Pt sleeping at the time of attempted TOC visit. Spoke with pt's daughter by phone to assess. Dtr states that pt is fairly independent at home. She does use a walker for ambulation. Pt's daughter drives pt to appointments. They do not have any difficulty obtaining medications. Dtr states pt tends to be forgetful.   Discussed HHC options with pt's daughter by phone. She requests referral to [facility]. Will refer.   It appears that pt will likely dc over the weekend. No other needs identified.  Expected Discharge Plan: [place] Barriers to Discharge: Continued Medical Work up   Patient Goals and CMS Choice        Expected Discharge Plan and Services Expected Discharge Plan: [place]       Living arrangements for the past 2 months: Single Family Home                                      Prior Living Arrangements/Services Living arrangements for the past 2 months: Single Family Home Lives with:: Spouse                   Activities of Daily Living Home Assistive Devices/Equipment: Eyeglasses, CBG Meter, Dentures (specify type)(upper and lower dentures (at home)) ADL Screening (condition at time of admission) Patient's cognitive ability adequate to safely complete daily activities?: Yes Is the patient deaf or have difficulty hearing?: No Does the patient have difficulty seeing, even when wearing glasses/contacts?: No Does the patient have difficulty concentrating, remembering, or making decisions?: No Patient able to express need for assistance with ADLs?: Yes Does the patient have difficulty  dressing or bathing?: No Independently performs ADLs?: Yes (appropriate for developmental age) Does the patient have difficulty walking or climbing stairs?: No Weakness of Legs: None Weakness of Arms/Hands: None  Permission Sought/Granted                  Emotional Assessment              Admission diagnosis:  Nausea [R11.0] NSTEMI (non-ST elevation myocardial infarction) ([place]) [I21.4] AKI (acute kidney injury) ([place]) [N17.9] Duodenitis [K29.80] Patient Active Problem List   Diagnosis Date Noted  . Elevated troponin 01/30/2019  . AKI (acute kidney injury) ([place])   . Duodenitis   . Ischemic cardiomyopathy   . Elevated troponin I level 01/29/2019  . Glaucoma 01/29/2019  . Mixed hyperlipidemia   . Essential hypertension   . Elevated serum creatinine   . Hypokalemia   . Acute gastroenteritis   . Unilateral primary osteoarthritis, right knee 03/27/2018  . Anemia, iron deficiency 11/13/2013  . High cholesterol 11/13/2013  . Type 2 diabetes mellitus ([place]) 11/13/2013   PCP:  Lemmie Evens, MD Pharmacy:   [facility], [address] [place] [address] Phone: [phone] Fax: [phone]     Social Determinants of Health (SDOH) Interventions    Readmission Risk Interventions Readmission Risk Prevention Plan  01/31/2019  Transportation Screening Complete  PCP or Specialist Appt within 3-5 Days Not Complete  Not Complete comments Anticipating weekend dc. MD office not open today  [place] or [place] Complete  Social Work Consult for [place] Planning/Counseling Complete  Palliative Care Screening Not Applicable  Medication Review Press photographer) Complete  Some recent data might be hidden

## 2019-01-31 NOTE — Progress Notes (Signed)
PROGRESS NOTE  Caitlin Reed DVV:[id number] DOB: 1936/05/03 DOA: 01/29/2019 PCP: Lemmie Evens, MD   Brief History:  83 year old female with a history of hypertension, hyperlipidemia, diabetes mellitus presenting with nonspecific symptoms including fatigue, general malaise, nausea and decreased oral intake that began on 01/25/2019.  Upon further questioning, the patient actually stated that her nonspecific symptoms actually began slowly over 1 week ago worsening over the past weekend around 01/25/2019.  The patient had one episode of emesis and subsequently felt increasing generalized weakness and malaise.  She had denied any chest discomfort, dyspnea on exertion, dizziness, syncope.  She has not had any fevers, chills, chest pain, headache, sore throat, diarrhea, dysuria, hematuria, hematochezia, melena.  She stated that she took 3 Alka-Seltzer tablets which made her feel better temporarily, but her symptoms would recur.  She has not had recent travels or sick contacts.  She has not had any new changes to any of her medications.  Upon presentation, the patient was afebrile hemodynamically stable saturating 94-95% on room air.  BMP showed a serum creatinine of 1.55.  Her LFTs and lipase were unremarkable.  CBC was unremarkable with WBC 8.9, hemoglobin 11.9, platelets 220,000.  Urinalysis denies have significant pyuria.  CT of the abdomen and pelvis showed peripancreatic inflammation and moderate inflammatory changes to the second and third portion of the duodenum with extensive interstitial changes in the upper retroperitoneum.  The patient had an elevated highly sensitive troponin 1083>>> 934>>> 845.  Cardiology was consulted to assist with management.  Heparin drip was started.  Assessment/Plan: Acute gastroenteritis/duodenitis -Continue IV fluids -Clear liquid diet if not anticipated cardiac procedures -GI consult--suspected ischemic vs food borne -improved clinically--no abd pain,  tolerating diet now -Continue Protonix twice daily>>>once daily -SARS-CoV2--negative  Elevated troponin -Appreciate cardiology consult -Patient has no symptoms of chest discomfort or exertional dyspnea -Continue IV heparin x 48 hours per cardiology -personally reviewed EKG--ST elevation V1-V3 without reciprocal changes, nonspecific T wave changes -add plavix to ASA  Cardiomyopathy -01/29/2019 echo EF 35%, AK apical inferior wall, anterior septal, anterior wall, RVSP 47.6, mild TR -Discontinue diltiazem -continue bisoprolol  Renal failure, unknown chronicity -Patient presented with serum creatinine 1.55 -Urine protein creatinine ratio--0.06 -FeNa--0.05% -serum creatinine creeping up -renal US  Abnormal CT abdomen pelvis -01/29/2019 CT abdomen/pelvis--peripancreatic inflammation--> lipase is normal--this likely represents inflammatory changes from the patient's duodenitis -GI consult appreciated-->suspect food borne illness as pt also endorsed diarrhea (resolved)  Essential hypertension -Holding HCTZ and diltiazem -continue bisoprolol  Diabetes mellitus type 2 -Holding glimepiride -Hemoglobin A1c--9.9 -NovoLog sliding scale  Mixed hyperlipidemia -Continue pravastatin -LDL 79 -intensify therapy to atorvastatin  Hyperkalemia -remove K from IVF -start IVF without K -anticipate improvement without lokelma -repeat BMP today     Disposition Plan:   Home 7/4 or 7/5  Family Communication:  Daughter updated 7/3  Consultants:  GI, cardiology  Code Status:  FULL   DVT Prophylaxis:  IV Heparin    Procedures: As Listed in Progress Note Above  Antibiotics: None        Subjective: Pt is feeling better.  She wants to eat.  Denies f/c, abd pain, n/v/d.  Denies cp, sob, headache, dizziness, dysuria.    Objective: Vitals:   01/31/19 0500 01/31/19 0745 01/31/19 0800 01/31/19 1122  BP: (!) 95/53  100/61   Pulse: (!) 51  (!) 57   Resp: 18  13    Temp:  97.7 F (36.5 C)  97.7 F (36.5  C)  TempSrc:  Oral  Oral  SpO2: 94%  94%   Weight: 77 kg     Height:        Intake/Output Summary (Last 24 hours) at 01/31/2019 1301 Last data filed at 01/31/2019 1013 Gross per 24 hour  Intake 2139.43 ml  Output 220 ml  Net 1919.43 ml   Weight change: 2.157 kg Exam:   General:  Pt is alert, follows commands appropriately, not in acute distress  HEENT: No icterus, No thrush, No neck mass, [place]/AT  Cardiovascular: RRR, S1/S2, no rubs, no gallops  Respiratory: fine bibasilar rales. No wheeze  Abdomen: Soft/+BS, non tender, non distended, no guarding  Extremities: No edema, No lymphangitis, No petechiae, No rashes, no synovitis   Data Reviewed: I have personally reviewed following labs and imaging studies Basic Metabolic Panel: Recent Labs  Lab 01/29/19 1142 01/29/19 1544 01/30/19 0516 01/31/19 0531  NA 135  --  135 137  K 3.4*  --  4.3 5.5*  CL 96*  --  97* 102  CO2 25  --  22 25  GLUCOSE 267*  --  193* 97  BUN 37*  --  44* 47*  CREATININE 1.55*  --  1.65* 1.94*  CALCIUM 9.0  --  8.6* 8.3*  MG  --  1.9  --  2.3  PHOS  --  2.9  --   --    Liver Function Tests: Recent Labs  Lab 01/29/19 1142 01/30/19 0516  AST 19 13*  ALT 11 10  ALKPHOS 80 69  BILITOT 0.6 0.4  PROT 7.1 6.0*  ALBUMIN 3.1* 2.6*   Recent Labs  Lab 01/29/19 1142  LIPASE 23   No results for input(s): AMMONIA in the last 168 hours. Coagulation Profile: Recent Labs  Lab 01/29/19 1355  INR 1.1   CBC: Recent Labs  Lab 01/29/19 1142 01/30/19 0516 01/31/19 0531  WBC 8.9 8.2 7.5  NEUTROABS 6.3  --   --   HGB 13.1 11.9* 10.4*  HCT 41.2 37.3 33.2*  MCV 93.8 94.7 96.8  PLT 229 220 180   Cardiac Enzymes: Recent Labs  Lab 01/30/19 0516  CKTOTAL 79   BNP: Invalid input(s): POCBNP CBG: Recent Labs  Lab 01/30/19 1635 01/30/19 2218 01/31/19 0306 01/31/19 0728 01/31/19 1142  GLUCAP 208* 73 89 92 161*   HbA1C: Recent Labs    01/30/19  0516  HGBA1C 9.9*   Urine analysis:    Component Value Date/Time   COLORURINE YELLOW 01/29/2019 2000   APPEARANCEUR CLEAR 01/29/2019 2000   LABSPEC 1.026 01/29/2019 2000   PHURINE 5.0 01/29/2019 2000   GLUCOSEU NEGATIVE 01/29/2019 2000   HGBUR NEGATIVE 01/29/2019 2000   BILIRUBINUR NEGATIVE 01/29/2019 2000   KETONESUR 5 (A) 01/29/2019 2000   PROTEINUR 30 (A) 01/29/2019 2000   NITRITE NEGATIVE 01/29/2019 2000   LEUKOCYTESUR NEGATIVE 01/29/2019 2000   Sepsis Labs: @LABRCNTIP (procalcitonin:4,lacticidven:4) ) Recent Results (from the past 240 hour(s))  SARS Coronavirus 2 (CEPHEID - Performed in [place] [facility] hospital lab), Hosp Order     Status: None   Collection Time: 01/29/19  1:17 PM   Specimen: Nasopharyngeal Swab  Result Value Ref Range Status   SARS Coronavirus 2 NEGATIVE NEGATIVE Final    Comment: (NOTE) If result is NEGATIVE SARS-CoV-2 target nucleic acids are NOT DETECTED. The SARS-CoV-2 RNA is generally detectable in upper and lower  respiratory specimens during the acute phase of infection. The lowest  concentration of SARS-CoV-2 viral copies this assay can detect is 250  copies / mL. A negative result does not preclude SARS-CoV-2 infection  and should not be used as the sole basis for treatment or other  patient management decisions.  A negative result may occur with  improper specimen collection / handling, submission of specimen other  than nasopharyngeal swab, presence of viral mutation(s) within the  areas targeted by this assay, and inadequate number of viral copies  (<250 copies / mL). A negative result must be combined with clinical  observations, patient history, and epidemiological information. If result is POSITIVE SARS-CoV-2 target nucleic acids are DETECTED. The SARS-CoV-2 RNA is generally detectable in upper and lower  respiratory specimens dur ing the acute phase of infection.  Positive  results are indicative of active infection with SARS-CoV-2.   Clinical  correlation with patient history and other diagnostic information is  necessary to determine patient infection status.  Positive results do  not rule out bacterial infection or co-infection with other viruses. If result is PRESUMPTIVE POSTIVE SARS-CoV-2 nucleic acids MAY BE PRESENT.   A presumptive positive result was obtained on the submitted specimen  and confirmed on repeat testing.  While 2019 novel coronavirus  (SARS-CoV-2) nucleic acids may be present in the submitted sample  additional confirmatory testing may be necessary for epidemiological  and / or clinical management purposes  to differentiate between  SARS-CoV-2 and other Sarbecovirus currently known to infect humans.  If clinically indicated additional testing with an alternate test  methodology [phone]) is advised. The SARS-CoV-2 RNA is generally  detectable in upper and lower respiratory sp ecimens during the acute  phase of infection. The expected result is Negative. Fact Sheet for Patients:  StrictlyIdeas.no Fact Sheet for Healthcare Providers: BankingDealers.co.za This test is not yet approved or cleared by the [place] FDA and has been authorized for detection and/or diagnosis of SARS-CoV-2 by FDA under an Emergency Use Authorization (EUA).  This EUA will remain in effect (meaning this test can be used) for the duration of the COVID-19 declaration under Section 564(b)(1) of the Act, 21 U.S.C. section 360bbb-3(b)(1), unless the authorization is terminated or revoked sooner. Performed at [facility], [address] [place], [place] [address]   MRSA PCR Screening     Status: None   Collection Time: 01/29/19  4:45 PM   Specimen: Nasopharyngeal  Result Value Ref Range Status   MRSA by PCR NEGATIVE NEGATIVE Final    Comment:        The GeneXpert MRSA Assay (FDA approved for NASAL specimens only), is one component of a comprehensive MRSA colonization  surveillance program. It is not intended to diagnose MRSA infection nor to guide or monitor treatment for MRSA infections. Performed at [facility], [address], [place], [place] [address]   Urine culture     Status: None   Collection Time: 01/29/19  8:00 PM   Specimen: Urine, Random  Result Value Ref Range Status   Specimen Description   Final    URINE, RANDOM Performed at [facility], [address] [place], [place] [address]    Special Requests   Final    NONE Performed at [facility], [address], [place], [place] [address]    Culture   Final    NO GROWTH Performed at [facility], [place] [address] [place],  [address]    Report Status 01/31/2019 FINAL  Final     Scheduled Meds: . aspirin EC  81 mg Oral Daily  . bisoprolol  2.5 mg Oral Daily  .  Chlorhexidine Gluconate Cloth  6 each Topical Daily  . clopidogrel  75 mg Oral Daily  . dorzolamide-timolol  1 drop Both Eyes QHS  . insulin aspart  0-5 Units Subcutaneous QHS  . insulin aspart  0-9 Units Subcutaneous TID WC  . latanoprost  1 drop Both Eyes QHS  . pantoprazole  40 mg Oral Daily  . pravastatin  40 mg Oral Daily   Continuous Infusions: . sodium chloride 75 mL/hr at 01/31/19 1013  . heparin 800 Units/hr (01/29/19 1359)    Procedures/Studies: Ct Abdomen Pelvis Wo Contrast  Result Date: 01/29/2019 CLINICAL DATA:  Abdominal pain, nausea and vomiting. EXAM: CT ABDOMEN AND PELVIS WITHOUT CONTRAST TECHNIQUE: Multidetector CT imaging of the abdomen and pelvis was performed following the standard protocol without IV contrast. COMPARISON:  None. FINDINGS: Lower chest: The lung bases are clear of an acute process. No pleural effusions. The heart is normal in size. No pericardial effusion. Distal esophagus is grossly normal. Hepatobiliary: No focal hepatic lesions or intrahepatic biliary dilatation. The gallbladder appears normal. No common bile duct dilatation. Pancreas: Largely fatty replaced pancreas.  Fairly extensive peripancreatic inflammatory process but the patient's lipases normal and this is unlikely acute pancreatitis given that fact. Spleen: Normal size.  No focal lesions. Adrenals/Urinary Tract: The adrenal glands and kidneys are unremarkable. No renal, ureteral or bladder calculi or obvious mass without contrast. Stomach/Bowel: There is a calcified lesion projecting off the posterior aspect of the gastric fundus. This measures 2.8 cm. It could be a calcified leiomyoma or possibly a gastric diverticulum with chronic debris. Moderate inflammatory changes around the second and third portions of the duodenum along with extensive interstitial changes in the lesser sac, anterior pararenal spaces, upper retroperitoneum and peripancreatic space. This is likely related to duodenitis. I do not see any evidence of free air to suggest a perforated gastric ulcer or duodenal ulcer. The small bowel and colon are grossly normal without oral contrast. The terminal ileum and appendix are normal. Sigmoid colon diverticulosis without findings for acute diverticulitis. Vascular/Lymphatic: Moderate to advanced atherosclerotic calcifications involving the aorta and branch vessels but no aneurysm. No mesenteric or retroperitoneal mass or adenopathy. Reproductive: The uterus and ovaries are grossly normal. Small cyst associated with the right ovary. Other: Small amount of free pelvic fluid but no pelvic mass or pelvic adenopathy. No inguinal mass or adenopathy. Musculoskeletal: Severe degenerative changes involving both hips, right greater than left with a moderate-sized right hip joint effusion and probable synovitis. Degenerative lumbar spondylosis with multilevel disc disease and facet disease. IMPRESSION: 1. Fairly extensive epigastric inflammatory process as detailed above. Although this could be pancreatitis the patient's lipase level was normal making this unlikely. This is probably secondary to duodenitis without  definite ulcer or perforation. 2. 2.8 cm lesion near the gastric fundus could be a diverticulum or partially calcified leiomyoma. 3. No renal, ureteral or bladder calculi. 4. No CT findings suspicious for acute cholecystitis. 5. Age related vascular calcifications. 6. Severe right hip joint degenerative changes and moderate right hip joint effusion. Electronically Signed   By: Marijo Sanes M.D.   On: 01/29/2019 13:17   Dg Chest 2 View  Result Date: 01/29/2019 CLINICAL DATA:  Nausea, vomiting. EXAM: CHEST - 2 VIEW COMPARISON:  Radiographs of August 11, 2013. FINDINGS: The heart size and mediastinal contours are within normal limits. Both lungs are clear. The visualized skeletal structures are unremarkable. IMPRESSION: No active cardiopulmonary disease. Aortic Atherosclerosis (ICD10-I70.0). Electronically Signed   By: Bobbe Medico.D.  On: 01/29/2019 12:56    Orson Eva, DO  [facility] Pager [phone]  If 7PM-7AM, please contact night-coverage [email or web address] Password [email or web address] 01/31/2019, 1:01 PM   LOS: 1 day

## 2019-01-31 NOTE — Progress Notes (Signed)
Patient denies any complaints other than being very hungry. Specifically denies abdominal pain, nausea vomiting or diarrhea; denies chest pain or shortness of breath.  Nursing staff reports no issues overnight.  Vital signs in last 24 hours: Temp:  [97.7 F (36.5 C)-98.4 F (36.9 C)] 97.7 F (36.5 C) (07/03 0745) Pulse Rate:  [47-71] 51 (07/03 0500) Resp:  [13-21] 18 (07/03 0500) BP: (84-100)/(42-72) 95/53 (07/03 0500) SpO2:  [90 %-98 %] 94 % (07/03 0800) Weight:  [77 kg] 77 kg (07/03 0500) Last BM Date: 01/28/19 General:   Alert,   pleasant and cooperative in NAD Abdomen: Nondistended.  Positive bowel sounds.  Soft and nontender.   Extremities:  Without clubbing or edema.    Intake/Output from previous day: 07/02 0701 - 07/03 0700 In: 1515.8 [P.O.:220; I.V.:1295.8] Out: 170 [Urine:170] Intake/Output this shift: No intake/output data recorded.  Lab Results: Recent Labs    01/29/19 1142 01/30/19 0516 01/31/19 0531  WBC 8.9 8.2 7.5  HGB 13.1 11.9* 10.4*  HCT 41.2 37.3 33.2*  PLT 229 220 180   BMET Recent Labs    01/29/19 1142 01/30/19 0516 01/31/19 0531  NA 135 135 137  K 3.4* 4.3 5.5*  CL 96* 97* 102  CO2 25 22 25   GLUCOSE 267* 193* 97  BUN 37* 44* 47*  CREATININE 1.55* 1.65* 1.94*  CALCIUM 9.0 8.6* 8.3*   LFT Recent Labs    01/30/19 0516  PROT 6.0*  ALBUMIN 2.6*  AST 13*  ALT 10  ALKPHOS 69  BILITOT 0.4   PT/INR Recent Labs    01/29/19 1355  LABPROT 14.0  INR 1.1   Hepatitis Panel No results for input(s): HEPBSAG, HCVAB, HEPAIGM, HEPBIGM in the last 72 hours. C-Diff No results for input(s): CDIFFTOX in the last 72 hours.  Studies/Results: Ct Abdomen Pelvis Wo Contrast  Result Date: 01/29/2019 CLINICAL DATA:  Abdominal pain, nausea and vomiting. EXAM: CT ABDOMEN AND PELVIS WITHOUT CONTRAST TECHNIQUE: Multidetector CT imaging of the abdomen and pelvis was performed following the standard protocol without IV contrast. COMPARISON:  None.  FINDINGS: Lower chest: The lung bases are clear of an acute process. No pleural effusions. The heart is normal in size. No pericardial effusion. Distal esophagus is grossly normal. Hepatobiliary: No focal hepatic lesions or intrahepatic biliary dilatation. The gallbladder appears normal. No common bile duct dilatation. Pancreas: Largely fatty replaced pancreas. Fairly extensive peripancreatic inflammatory process but the patient's lipases normal and this is unlikely acute pancreatitis given that fact. Spleen: Normal size.  No focal lesions. Adrenals/Urinary Tract: The adrenal glands and kidneys are unremarkable. No renal, ureteral or bladder calculi or obvious mass without contrast. Stomach/Bowel: There is a calcified lesion projecting off the posterior aspect of the gastric fundus. This measures 2.8 cm. It could be a calcified leiomyoma or possibly a gastric diverticulum with chronic debris. Moderate inflammatory changes around the second and third portions of the duodenum along with extensive interstitial changes in the lesser sac, anterior pararenal spaces, upper retroperitoneum and peripancreatic space. This is likely related to duodenitis. I do not see any evidence of free air to suggest a perforated gastric ulcer or duodenal ulcer. The small bowel and colon are grossly normal without oral contrast. The terminal ileum and appendix are normal. Sigmoid colon diverticulosis without findings for acute diverticulitis. Vascular/Lymphatic: Moderate to advanced atherosclerotic calcifications involving the aorta and branch vessels but no aneurysm. No mesenteric or retroperitoneal mass or adenopathy. Reproductive: The uterus and ovaries are grossly normal. Small cyst associated with the  right ovary. Other: Small amount of free pelvic fluid but no pelvic mass or pelvic adenopathy. No inguinal mass or adenopathy. Musculoskeletal: Severe degenerative changes involving both hips, right greater than left with a moderate-sized  right hip joint effusion and probable synovitis. Degenerative lumbar spondylosis with multilevel disc disease and facet disease. IMPRESSION: 1. Fairly extensive epigastric inflammatory process as detailed above. Although this could be pancreatitis the patient's lipase level was normal making this unlikely. This is probably secondary to duodenitis without definite ulcer or perforation. 2. 2.8 cm lesion near the gastric fundus could be a diverticulum or partially calcified leiomyoma. 3. No renal, ureteral or bladder calculi. 4. No CT findings suspicious for acute cholecystitis. 5. Age related vascular calcifications. 6. Severe right hip joint degenerative changes and moderate right hip joint effusion. Electronically Signed   By: Marijo Sanes M.D.   On: 01/29/2019 13:17   Dg Chest 2 View  Result Date: 01/29/2019 CLINICAL DATA:  Nausea, vomiting. EXAM: CHEST - 2 VIEW COMPARISON:  Radiographs of August 11, 2013. FINDINGS: The heart size and mediastinal contours are within normal limits. Both lungs are clear. The visualized skeletal structures are unremarkable. IMPRESSION: No active cardiopulmonary disease. Aortic Atherosclerosis (ICD10-I70.0). Electronically Signed   By: Marijo Conception M.D.   On: 01/29/2019 12:56    Impression:  Acute GI symptoms including nausea vomiting nonbloody diarrhea have apparently resolved.  Abnormal duodenum -  All likely representative of a self-limiting foodborne illness.  Paucity of abdominal pain makes ischemia much less likely.  Recommendations: Advance diet.  Decrease Protonix to 40 mg once daily which should suffice as empiric treatment  Outpatient follow-up with Dr. Laural Golden as outlined in yesterday's GI note.

## 2019-01-31 NOTE — TOC Initial Note (Signed)
Transition of Care [facility]) - Initial/Assessment Note    Patient Details  Name: TORIANNE LAFLAM MRN: [id number] Date of Birth: Jul 21, 1936  [facility]) CM/SW Contact:    Shade Flood, LCSW Phone Number: 01/31/2019, 11:35 AM  Clinical Narrative:                  Pt admitted from home. She is high readmission risk. Pt sleeping at the time of attempted TOC visit. Spoke with pt's daughter by phone to assess. Dtr states that pt is fairly independent at home. She does use a walker for ambulation. Pt's daughter drives pt to appointments. They do not have any difficulty obtaining medications. Dtr states pt tends to be forgetful.   Discussed HHC options with pt's daughter by phone. She requests referral to [facility]. Will refer.   It appears that pt will likely dc over the weekend. No other needs identified.  Expected Discharge Plan: [place] Barriers to Discharge: Continued Medical Work up   Patient Goals and CMS Choice   CMS Medicare.gov Compare Post Acute Care list provided to:: Patient Represenative (must comment)(Daughter) Choice offered to / list presented to : Adult Children  Expected Discharge Plan and Services Expected Discharge Plan: [place] In-house Referral: Clinical Social Work   Post Acute Care Choice: [place] arrangements for the past 2 months: [place]: RN, PT [place] Agency: [facility] Date [place]: 01/31/19 Time [place]: 1134 Representative spoke with at [place]: Dorian Pod  Prior Living Arrangements/Services Living arrangements for the past 2 months: [place] Lives with:: Spouse Patient language and need for interpreter reviewed:: Yes Do you feel safe going back to the place where you live?: Yes      Need for Family Participation in Patient Care: Yes (Comment) Care giver support system in place?: Yes (comment)    Criminal Activity/Legal Involvement Pertinent to Current Situation/Hospitalization: No - Comment as needed  Activities of Daily Living Home Assistive Devices/Equipment: Eyeglasses, CBG Meter, Dentures (specify type)(upper and lower dentures (at home)) ADL Screening (condition at time of admission) Patient's cognitive ability adequate to safely complete daily activities?: Yes Is the patient deaf or have difficulty hearing?: No Does the patient have difficulty seeing, even when wearing glasses/contacts?: No Does the patient have difficulty concentrating, remembering, or making decisions?: No Patient able to express need for assistance with ADLs?: Yes Does the patient have difficulty dressing or bathing?: No Independently performs ADLs?: Yes (appropriate for developmental age) Does the patient have difficulty walking or climbing stairs?: No Weakness of Legs: None Weakness of Arms/Hands: None  Permission Sought/Granted Permission sought to share information with : Chartered certified accountant granted to share information with : Yes, Verbal Permission Granted     Permission granted to share info w AGENCY: [facility]        Emotional Assessment Appearance:: Appears stated age     Orientation: : Oriented to Self, Oriented to Place, Oriented to  Time, Oriented to Situation Alcohol / Substance Use: Not Applicable Psych Involvement: No (comment)  Admission diagnosis:  Nausea [R11.0] NSTEMI (non-ST elevation myocardial infarction) ([place]) [I21.4] AKI (acute kidney injury) ([place]) [N17.9] Duodenitis [K29.80] Patient Active Problem List   Diagnosis Date Noted  . Elevated troponin 01/30/2019  . AKI (acute  kidney injury) ([place])   . Duodenitis   . Ischemic cardiomyopathy   . Elevated troponin I level 01/29/2019  . Glaucoma 01/29/2019  . Mixed hyperlipidemia   . Essential hypertension   . Elevated serum creatinine   . Hypokalemia   . Acute gastroenteritis   . Unilateral  primary osteoarthritis, right knee 03/27/2018  . Anemia, iron deficiency 11/13/2013  . High cholesterol 11/13/2013  . Type 2 diabetes mellitus ([place]) 11/13/2013   PCP:  Lemmie Evens, MD Pharmacy:   [facility], [address] [place] [address] Phone: [phone] Fax: [phone]     Social Determinants of Health (SDOH) Interventions    Readmission Risk Interventions Readmission Risk Prevention Plan 01/31/2019  Transportation Screening Complete  PCP or Specialist Appt within 3-5 Days Not Complete  Not Complete comments Anticipating weekend dc. MD office not open today  [place] or [place] Complete  Social Work Consult for [place] Planning/Counseling Complete  Palliative Care Screening Not Applicable  Medication Review Press photographer) Complete  Some recent data might be hidden

## 2019-01-31 NOTE — Progress Notes (Signed)
Pt refusing to wear BP cuff, ekg leads. Refuses IV fluid. States, "there is nothing wrong with me I want to go home, Im not doing any of that". Pt was educated on benefits of monitoring equipment and importance of IVF. Pt was educated on her lab and diagnostic results. Pt still refuses to participate in care and states she wants to leave as soon as possible.

## 2019-01-31 NOTE — Progress Notes (Signed)
Inpatient Diabetes Program Recommendations  AACE/ADA: New Consensus Statement on Inpatient Glycemic Control (2015)  Target Ranges:  Prepandial:   less than 140 mg/dL      Peak postprandial:   less than 180 mg/dL (1-2 hours)      Critically ill patients:  140 - 180 mg/dL   Lab Results  Component Value Date   GLUCAP 92 01/31/2019   HGBA1C 9.9 (H) 01/30/2019    Review of Glycemic Control Results for Caitlin Reed, Caitlin Reed (MRN [id number]) as of 01/31/2019 09:49  Ref. Range 01/30/2019 11:39 01/30/2019 16:35 01/30/2019 22:18 01/31/2019 03:06 01/31/2019 07:28  Glucose-Capillary Latest Ref Range: 70 - 99 mg/dL 275 (H) 208 (H) 73 89 92   Inpatient Diabetes Program Recommendations:   -Custom Novolog correction scale 0-5 units       151-200  1 unit      201-250  2 units      251-300  3 units      301-350  4 units      351-400  5 units D/C hs correction  Thank you, Bethena Roys E. Anusha Claus, RN, MSN, CDE  Diabetes Coordinator Inpatient Glycemic Control Team Team Pager [phone] (8am-5pm) 01/31/2019 9:50 AM

## 2019-01-31 NOTE — Discharge Instructions (Signed)
[facility] will call Cecille Rubin to schedule nurse and physical therapy visits to your home. Their number is [phone]

## 2019-01-31 NOTE — Progress Notes (Signed)
[name] for heparin Indication: ACS/STEMI  No Known Allergies  Patient Measurements: Height: 5\' 3"  (160 cm) Weight: 169 lb 12.1 oz (77 kg) IBW/kg (Calculated) : 52.4 Heparin Dosing Weight: 68.3 kg  Vital Signs: Temp: 97.7 F (36.5 C) (07/03 0745) Temp Source: Oral (07/03 0745) BP: 95/53 (07/03 0500) Pulse Rate: 51 (07/03 0500)  Labs: Recent Labs    01/29/19 1142 01/29/19 1355 01/29/19 1832 01/29/19 2220 01/30/19 0056 01/30/19 0516 01/31/19 0531  HGB 13.1  --   --   --   --  11.9* 10.4*  HCT 41.2  --   --   --   --  37.3 33.2*  PLT 229  --   --   --   --  220 180  APTT  --  23*  --   --   --   --   --   LABPROT  --  14.0  --   --   --   --   --   INR  --  1.1  --   --   --   --   --   HEPARINUNFRC  --   --   --  0.46  --  0.39 0.53  CREATININE 1.55*  --   --   --   --  1.65* 1.94*  CKTOTAL  --   --   --   --   --  79  --   TROPONINIHS 1,083.00* 934.00* 845.00*  --  761.00*  --   --     Estimated Creatinine Clearance: 22.3 mL/min (A) (by C-G formula based on SCr of 1.94 mg/dL (H)).  Assessment: 83 y.o. female with elevated cardiac markers for heparin   HL: 0.53  Goal of Therapy:  Heparin level 0.3-0.7 units/ml Monitor platelets by anticoagulation protocol: Yes   Plan:  Continue heparin infusion at 800 unit/hr Check heparin level daily while on heparin Continue to monitor H&H and platelets   Caitlin Reed, PharmD Clinical Pharmacist 01/31/2019 8:40 AM

## 2019-01-31 NOTE — Care Management Important Message (Signed)
Important Message  Patient Details  Name: Caitlin Reed MRN: [id number] Date of Birth: 1935-08-18   Medicare Important Message Given:  Yes     Tommy Medal 01/31/2019, 11:54 AM

## 2019-02-01 ENCOUNTER — Inpatient Hospital Stay (HOSPITAL_COMMUNITY): Payer: PPO

## 2019-02-01 LAB — CBC
HCT: 32.7 % — ABNORMAL LOW (ref 36.0–46.0)
Hemoglobin: 10.1 g/dL — ABNORMAL LOW (ref 12.0–15.0)
MCH: 30.2 pg (ref 26.0–34.0)
MCHC: 30.9 g/dL (ref 30.0–36.0)
MCV: 97.9 fL (ref 80.0–100.0)
Platelets: 164 10*3/uL (ref 150–400)
RBC: 3.34 MIL/uL — ABNORMAL LOW (ref 3.87–5.11)
RDW: 12.9 % (ref 11.5–15.5)
WBC: 6.9 10*3/uL (ref 4.0–10.5)
nRBC: 0 % (ref 0.0–0.2)

## 2019-02-01 LAB — BASIC METABOLIC PANEL
Anion gap: 9 (ref 5–15)
BUN: 45 mg/dL — ABNORMAL HIGH (ref 8–23)
CO2: 24 mmol/L (ref 22–32)
Calcium: 8.1 mg/dL — ABNORMAL LOW (ref 8.9–10.3)
Chloride: 101 mmol/L (ref 98–111)
Creatinine, Ser: 1.87 mg/dL — ABNORMAL HIGH (ref 0.44–1.00)
GFR calc Af Amer: 29 mL/min — ABNORMAL LOW (ref >60)
GFR calc non Af Amer: 25 mL/min — ABNORMAL LOW (ref >60)
Glucose, Bld: 167 mg/dL — ABNORMAL HIGH (ref 70–99)
Potassium: 4.5 mmol/L (ref 3.5–5.1)
Sodium: 134 mmol/L — ABNORMAL LOW (ref 135–145)

## 2019-02-01 LAB — GLUCOSE, CAPILLARY
Glucose-Capillary: 164 mg/dL — ABNORMAL HIGH (ref 70–99)
Glucose-Capillary: 181 mg/dL — ABNORMAL HIGH (ref 70–99)

## 2019-02-01 MED ORDER — BISOPROLOL FUMARATE 5 MG PO TABS: 2.5000 mg | ORAL_TABLET | Freq: Every day | ORAL | 1 refills | Status: DC

## 2019-02-01 MED ORDER — PANTOPRAZOLE SODIUM 40 MG PO TBEC: 40.0000 mg | DELAYED_RELEASE_TABLET | Freq: Every day | ORAL | 1 refills | Status: AC

## 2019-02-01 MED ORDER — CLOPIDOGREL BISULFATE 75 MG PO TABS: 75.0000 mg | ORAL_TABLET | Freq: Every day | ORAL | 1 refills | Status: AC

## 2019-02-01 MED ORDER — ATORVASTATIN CALCIUM 20 MG PO TABS: 20.0000 mg | ORAL_TABLET | Freq: Every day | ORAL | 1 refills | Status: AC

## 2019-02-01 NOTE — Progress Notes (Signed)
Patient given discharge instructions and verbalized understanding. Patient to be discharged to home. Two peripheral IVs removed prior to discharge. Patient taken down via wheelchair to be picked up by daughter.

## 2019-02-01 NOTE — Discharge Summary (Signed)
Physician Discharge Summary  Caitlin Reed HWT:[id number] DOB: 08-13-1935 DOA: 01/29/2019  PCP: Lemmie Evens, MD  Admit date: 01/29/2019 Discharge date: 02/01/2019  Admitted From: Home Disposition:  Home  Recommendations for Outpatient Follow-up:  1. Follow up with PCP in 1-2 weeks 2. Please obtain BMP/CBC in one week   Home Health: YES Equipment/Devices: HHPT and HHRN  Discharge Condition: Stable CODE STATUS: FULL Diet recommendation: Heart Healthy / Carb Modified   Brief/Interim Summary: 83 year old female with a history of hypertension, hyperlipidemia, diabetes mellitus presenting with nonspecific symptoms including fatigue, general malaise, nausea and decreased oral intake that began on 01/25/2019. Upon further questioning, the patient actually stated that her nonspecific symptoms actually began slowly over 1 week ago worsening over the past weekend around 01/25/2019. The patient had one episode of emesis and subsequently felt increasing generalized weakness and malaise. She had denied any chest discomfort, dyspnea on exertion, dizziness, syncope. She has not had any fevers, chills, chest pain, headache, sore throat, diarrhea, dysuria, hematuria, hematochezia, melena. She stated that she took 3 Alka-Seltzer tablets which made her feel better temporarily, but her symptoms would recur. She has not had recent travels or sick contacts. She has not had any new changes to any of her medications. Upon presentation, the patient was afebrile hemodynamically stable saturating 94-95% on room air. BMP showed a serum creatinine of 1.55. Her LFTs and lipase were unremarkable. CBC was unremarkable with WBC 8.9, hemoglobin 11.9, platelets 220,000. Urinalysis denies have significant pyuria. CT of the abdomen and pelvis showed peripancreatic inflammation and moderate inflammatory changes to the second and third portion of the duodenum with extensive interstitial changes in the upper  retroperitoneum. The patient had an elevated highly sensitive troponin [id number]>>>[id number]>>>[id number]. Cardiology was consulted to assist with management. Heparin drip was started.  It was continued for 48 hours.  The patient remained clinically stable.  Echocardiogram showed EF 35%.  Cardiology recommended sending the patient home with Plavix and aspirin.  Bisoprolol 2.5 mg was added.  The patient statin therapy was intensified with atorvastatin.  GI was consulted for her duodenitis.  They recommended Protonix daily.  It was felt this may have been foodborne.  Diet was advanced which she tolerated.   Discharge Diagnoses:  Acute gastroenteritis/duodenitis -ContinueIV fluids -Clear liquid dietadvanced to cardiac diet which pt tolerated -GI consult--suspected ischemic vs food borne -improved clinically--no abd pain, tolerating diet now -Continue Protonix twice daily>>>once daily -SARS-CoV2--negative  Elevated troponin -Appreciate cardiology consult -Patient has no symptoms of chest discomfort or exertional dyspnea -Continue IV heparinx 48 hours per cardiology -finished IV heparin evening 7/3 -personally reviewed EKG--ST elevation V1-V3 without reciprocal changes, nonspecific T wave changes -added plavix to ASA  Cardiomyopathy/HFrEF -01/29/2019 echo EF 35%, AK apical inferior wall, anterior septal, anterior wall, RVSP 47.6, mild TR -Discontinue diltiazem -continue bisoprolol -daily weights after d/c and take lasix 20 mg daily prn weight gain > 3 pounds  Renal failure, unknown chronicity -Patient presented with serum creatinine 1.55 -likely CKD with baseline 1.5-1.8 -Urine protein creatinine ratio--0.06 -FeNa--0.05% -serum creatinine creeping up but ultimately plateaued  -renal US--neg hydronephrosis  Abnormal CT abdomen pelvis -01/29/2019 CT abdomen/pelvis--peripancreatic inflammation-->lipase is normal--this likely represents inflammatory changes from the patient's duodenitis -GI  consultappreciated-->suspect food borne illness as pt also endorsed diarrhea (resolved) -protoninx 40 mg daily  Essential hypertension -Holding HCTZ and diltiazem -continue bisoprolol  Diabetes mellitus type 2 -Holding glimepiride-->resume after d/c -Hemoglobin A1c--9.9--??spurious -CBGs controlled during hospitalization without minimal SSI -NovoLog sliding scale  Mixed hyperlipidemia -Continue pravastatin -LDL  79 -intensify therapy to atorvastatin  Hyperkalemia -remove K from IVF -started IVF without K -improved   Discharge Instructions   Allergies as of 02/01/2019   No Known Allergies     Medication List    STOP taking these medications   diltiazem 60 MG tablet Commonly known as: CARDIZEM   hydrochlorothiazide 25 MG tablet Commonly known as: HYDRODIURIL   pravastatin 40 MG tablet Commonly known as: PRAVACHOL     TAKE these medications   aspirin 81 MG tablet Take 81 mg by mouth daily.   atorvastatin 20 MG tablet Commonly known as: LIPITOR Take 1 tablet (20 mg total) by mouth daily at 6 PM.   bisoprolol 5 MG tablet Commonly known as: ZEBETA Take 0.5 tablets (2.5 mg total) by mouth daily. Start taking on: February 02, 2019   calcium carbonate 600 MG Tabs tablet Commonly known as: OS-CAL Take 1,200 mg by mouth daily.   cholecalciferol 10 MCG (400 UNIT) Tabs tablet Commonly known as: VITAMIN D3 Take 1,000 Units by mouth.   clopidogrel 75 MG tablet Commonly known as: PLAVIX Take 1 tablet (75 mg total) by mouth daily. Start taking on: February 02, 2019   dorzolamide-timolol 22.3-6.8 MG/ML ophthalmic solution Commonly known as: COSOPT Place 1 drop into both eyes at bedtime.   ferrous sulfate 325 (65 FE) MG tablet Commonly known as: FerrouSul Take 1 tablet (325 mg total) by mouth daily with breakfast.   furosemide 20 MG tablet Commonly known as: LASIX Take 20 mg by mouth as needed for fluid.   glimepiride 1 MG tablet Commonly known as: AMARYL Take  1 tablet by mouth 2 (two) times a day.   latanoprost 0.005 % ophthalmic solution Commonly known as: XALATAN Place 1 drop into both eyes at bedtime.   LORazepam 0.5 MG tablet Commonly known as: ATIVAN Take 0.5 mg by mouth daily as needed for anxiety.   omeprazole 20 MG capsule Commonly known as: PRILOSEC Take 20 mg by mouth daily.   pantoprazole 40 MG tablet Commonly known as: PROTONIX Take 1 tablet (40 mg total) by mouth daily. Start taking on: February 02, 2019      Follow-up Information    Satira Sark, MD Follow up in 1 week(s).   Specialty: Cardiology Contact information: [place] [address] [phone]          No Known Allergies  Consultations:  cardiology   Procedures/Studies: Ct Abdomen Pelvis Wo Contrast  Result Date: 01/29/2019 CLINICAL DATA:  Abdominal pain, nausea and vomiting. EXAM: CT ABDOMEN AND PELVIS WITHOUT CONTRAST TECHNIQUE: Multidetector CT imaging of the abdomen and pelvis was performed following the standard protocol without IV contrast. COMPARISON:  None. FINDINGS: Lower chest: The lung bases are clear of an acute process. No pleural effusions. The heart is normal in size. No pericardial effusion. Distal esophagus is grossly normal. Hepatobiliary: No focal hepatic lesions or intrahepatic biliary dilatation. The gallbladder appears normal. No common bile duct dilatation. Pancreas: Largely fatty replaced pancreas. Fairly extensive peripancreatic inflammatory process but the patient's lipases normal and this is unlikely acute pancreatitis given that fact. Spleen: Normal size.  No focal lesions. Adrenals/Urinary Tract: The adrenal glands and kidneys are unremarkable. No renal, ureteral or bladder calculi or obvious mass without contrast. Stomach/Bowel: There is a calcified lesion projecting off the posterior aspect of the gastric fundus. This measures 2.8 cm. It could be a calcified leiomyoma or possibly a gastric diverticulum with  chronic debris. Moderate inflammatory changes around the second  and third portions of the duodenum along with extensive interstitial changes in the lesser sac, anterior pararenal spaces, upper retroperitoneum and peripancreatic space. This is likely related to duodenitis. I do not see any evidence of free air to suggest a perforated gastric ulcer or duodenal ulcer. The small bowel and colon are grossly normal without oral contrast. The terminal ileum and appendix are normal. Sigmoid colon diverticulosis without findings for acute diverticulitis. Vascular/Lymphatic: Moderate to advanced atherosclerotic calcifications involving the aorta and branch vessels but no aneurysm. No mesenteric or retroperitoneal mass or adenopathy. Reproductive: The uterus and ovaries are grossly normal. Small cyst associated with the right ovary. Other: Small amount of free pelvic fluid but no pelvic mass or pelvic adenopathy. No inguinal mass or adenopathy. Musculoskeletal: Severe degenerative changes involving both hips, right greater than left with a moderate-sized right hip joint effusion and probable synovitis. Degenerative lumbar spondylosis with multilevel disc disease and facet disease. IMPRESSION: 1. Fairly extensive epigastric inflammatory process as detailed above. Although this could be pancreatitis the patient's lipase level was normal making this unlikely. This is probably secondary to duodenitis without definite ulcer or perforation. 2. 2.8 cm lesion near the gastric fundus could be a diverticulum or partially calcified leiomyoma. 3. No renal, ureteral or bladder calculi. 4. No CT findings suspicious for acute cholecystitis. 5. Age related vascular calcifications. 6. Severe right hip joint degenerative changes and moderate right hip joint effusion. Electronically Signed   By: Marijo Sanes M.D.   On: 01/29/2019 13:17   Dg Chest 2 View  Result Date: 01/29/2019 CLINICAL DATA:  Nausea, vomiting. EXAM: CHEST - 2 VIEW  COMPARISON:  Radiographs of August 11, 2013. FINDINGS: The heart size and mediastinal contours are within normal limits. Both lungs are clear. The visualized skeletal structures are unremarkable. IMPRESSION: No active cardiopulmonary disease. Aortic Atherosclerosis (ICD10-I70.0). Electronically Signed   By: Marijo Conception M.D.   On: 01/29/2019 12:56   US Renal  Result Date: 02/01/2019 CLINICAL DATA:  Acute renal failure. EXAM: RENAL / URINARY TRACT ULTRASOUND COMPLETE COMPARISON:  CT abdomen pelvis 01/29/2019 FINDINGS: Right Kidney: Renal measurements: 9.2 x 3.7 x 4.5 cm = volume: 79.9 mL . Echogenicity within normal limits. No mass or hydronephrosis visualized. Left Kidney: Renal measurements: 8.0 x 3.6 x 4.0 cm = volume: 60.7 mL. Echogenicity within normal limits. No mass or hydronephrosis visualized. Bladder: Appears normal for degree of bladder distention. IMPRESSION: No hydronephrosis. Electronically Signed   By: Lovey Newcomer M.D.   On: 02/01/2019 12:17        Discharge Exam: Vitals:   02/01/19 1131 02/01/19 1200  BP:    Pulse:    Resp: 12 (!) 22  Temp: 98.3 F (36.8 C)   SpO2:     Vitals:   02/01/19 0800 02/01/19 0900 02/01/19 1131 02/01/19 1200  BP: (!) 105/47 (!) 104/50    Pulse: (!) 54 (!) 54    Resp: 20 20 12  (!) 22  Temp:   98.3 F (36.8 C)   TempSrc:   Oral   SpO2: 93% 97%    Weight:      Height:        General: Pt is alert, awake, not in acute distress Cardiovascular: RRR, S1/S2 +, no rubs, no gallops Respiratory: CTA bilaterally, no wheezing, no rhonchi Abdominal: Soft, NT, ND, bowel sounds + Extremities: no edema, no cyanosis   The results of significant diagnostics from this hospitalization (including imaging, microbiology, ancillary and laboratory) are listed below for reference.  Significant Diagnostic Studies: Ct Abdomen Pelvis Wo Contrast  Result Date: 01/29/2019 CLINICAL DATA:  Abdominal pain, nausea and vomiting. EXAM: CT ABDOMEN AND PELVIS WITHOUT  CONTRAST TECHNIQUE: Multidetector CT imaging of the abdomen and pelvis was performed following the standard protocol without IV contrast. COMPARISON:  None. FINDINGS: Lower chest: The lung bases are clear of an acute process. No pleural effusions. The heart is normal in size. No pericardial effusion. Distal esophagus is grossly normal. Hepatobiliary: No focal hepatic lesions or intrahepatic biliary dilatation. The gallbladder appears normal. No common bile duct dilatation. Pancreas: Largely fatty replaced pancreas. Fairly extensive peripancreatic inflammatory process but the patient's lipases normal and this is unlikely acute pancreatitis given that fact. Spleen: Normal size.  No focal lesions. Adrenals/Urinary Tract: The adrenal glands and kidneys are unremarkable. No renal, ureteral or bladder calculi or obvious mass without contrast. Stomach/Bowel: There is a calcified lesion projecting off the posterior aspect of the gastric fundus. This measures 2.8 cm. It could be a calcified leiomyoma or possibly a gastric diverticulum with chronic debris. Moderate inflammatory changes around the second and third portions of the duodenum along with extensive interstitial changes in the lesser sac, anterior pararenal spaces, upper retroperitoneum and peripancreatic space. This is likely related to duodenitis. I do not see any evidence of free air to suggest a perforated gastric ulcer or duodenal ulcer. The small bowel and colon are grossly normal without oral contrast. The terminal ileum and appendix are normal. Sigmoid colon diverticulosis without findings for acute diverticulitis. Vascular/Lymphatic: Moderate to advanced atherosclerotic calcifications involving the aorta and branch vessels but no aneurysm. No mesenteric or retroperitoneal mass or adenopathy. Reproductive: The uterus and ovaries are grossly normal. Small cyst associated with the right ovary. Other: Small amount of free pelvic fluid but no pelvic mass or  pelvic adenopathy. No inguinal mass or adenopathy. Musculoskeletal: Severe degenerative changes involving both hips, right greater than left with a moderate-sized right hip joint effusion and probable synovitis. Degenerative lumbar spondylosis with multilevel disc disease and facet disease. IMPRESSION: 1. Fairly extensive epigastric inflammatory process as detailed above. Although this could be pancreatitis the patient's lipase level was normal making this unlikely. This is probably secondary to duodenitis without definite ulcer or perforation. 2. 2.8 cm lesion near the gastric fundus could be a diverticulum or partially calcified leiomyoma. 3. No renal, ureteral or bladder calculi. 4. No CT findings suspicious for acute cholecystitis. 5. Age related vascular calcifications. 6. Severe right hip joint degenerative changes and moderate right hip joint effusion. Electronically Signed   By: Marijo Sanes M.D.   On: 01/29/2019 13:17   Dg Chest 2 View  Result Date: 01/29/2019 CLINICAL DATA:  Nausea, vomiting. EXAM: CHEST - 2 VIEW COMPARISON:  Radiographs of August 11, 2013. FINDINGS: The heart size and mediastinal contours are within normal limits. Both lungs are clear. The visualized skeletal structures are unremarkable. IMPRESSION: No active cardiopulmonary disease. Aortic Atherosclerosis (ICD10-I70.0). Electronically Signed   By: Marijo Conception M.D.   On: 01/29/2019 12:56   US Renal  Result Date: 02/01/2019 CLINICAL DATA:  Acute renal failure. EXAM: RENAL / URINARY TRACT ULTRASOUND COMPLETE COMPARISON:  CT abdomen pelvis 01/29/2019 FINDINGS: Right Kidney: Renal measurements: 9.2 x 3.7 x 4.5 cm = volume: 79.9 mL . Echogenicity within normal limits. No mass or hydronephrosis visualized. Left Kidney: Renal measurements: 8.0 x 3.6 x 4.0 cm = volume: 60.7 mL. Echogenicity within normal limits. No mass or hydronephrosis visualized. Bladder: Appears normal for degree of bladder distention. IMPRESSION:  No  hydronephrosis. Electronically Signed   By: Lovey Newcomer M.D.   On: 02/01/2019 12:17     Microbiology: Recent Results (from the past 240 hour(s))  SARS Coronavirus 2 (CEPHEID - Performed in [facility] hospital lab), Hosp Order     Status: None   Collection Time: 01/29/19  1:17 PM   Specimen: Nasopharyngeal Swab  Result Value Ref Range Status   SARS Coronavirus 2 NEGATIVE NEGATIVE Final    Comment: (NOTE) If result is NEGATIVE SARS-CoV-2 target nucleic acids are NOT DETECTED. The SARS-CoV-2 RNA is generally detectable in upper and lower  respiratory specimens during the acute phase of infection. The lowest  concentration of SARS-CoV-2 viral copies this assay can detect is 250  copies / mL. A negative result does not preclude SARS-CoV-2 infection  and should not be used as the sole basis for treatment or other  patient management decisions.  A negative result may occur with  improper specimen collection / handling, submission of specimen other  than nasopharyngeal swab, presence of viral mutation(s) within the  areas targeted by this assay, and inadequate number of viral copies  (<250 copies / mL). A negative result must be combined with clinical  observations, patient history, and epidemiological information. If result is POSITIVE SARS-CoV-2 target nucleic acids are DETECTED. The SARS-CoV-2 RNA is generally detectable in upper and lower  respiratory specimens dur ing the acute phase of infection.  Positive  results are indicative of active infection with SARS-CoV-2.  Clinical  correlation with patient history and other diagnostic information is  necessary to determine patient infection status.  Positive results do  not rule out bacterial infection or co-infection with other viruses. If result is PRESUMPTIVE POSTIVE SARS-CoV-2 nucleic acids MAY BE PRESENT.   A presumptive positive result was obtained on the submitted specimen  and confirmed on repeat testing.  While 2019 novel  coronavirus  (SARS-CoV-2) nucleic acids may be present in the submitted sample  additional confirmatory testing may be necessary for epidemiological  and / or clinical management purposes  to differentiate between  SARS-CoV-2 and other Sarbecovirus currently known to infect humans.  If clinically indicated additional testing with an alternate test  methodology [phone]) is advised. The SARS-CoV-2 RNA is generally  detectable in upper and lower respiratory sp ecimens during the acute  phase of infection. The expected result is Negative. Fact Sheet for Patients:  StrictlyIdeas.no Fact Sheet for Healthcare Providers: BankingDealers.co.za This test is not yet approved or cleared by the [place] FDA and has been authorized for detection and/or diagnosis of SARS-CoV-2 by FDA under an Emergency Use Authorization (EUA).  This EUA will remain in effect (meaning this test can be used) for the duration of the COVID-19 declaration under Section 564(b)(1) of the Act, 21 U.S.C. section 360bbb-3(b)(1), unless the authorization is terminated or revoked sooner. Performed at [facility], [address] [place], [place] [address]   MRSA PCR Screening     Status: None   Collection Time: 01/29/19  4:45 PM   Specimen: Nasopharyngeal  Result Value Ref Range Status   MRSA by PCR NEGATIVE NEGATIVE Final    Comment:        The GeneXpert MRSA Assay (FDA approved for NASAL specimens only), is one component of a comprehensive MRSA colonization surveillance program. It is not intended to diagnose MRSA infection nor to guide or monitor treatment for MRSA infections. Performed at [facility], [address] [place], [place] [address]   Urine culture  Status: None   Collection Time: 01/29/19  8:00 PM   Specimen: Urine, Random  Result Value Ref Range Status   Specimen Description   Final    URINE, RANDOM Performed at [facility], [address], [place], [place] [address]    Special Requests   Final    NONE Performed at [facility], [address] [place], [place] [address]    Culture   Final    NO GROWTH Performed at [facility], [place] [address], [place], [place] [address]    Report Status 01/31/2019 FINAL  Final     Labs: Basic Metabolic Panel: Recent Labs  Lab 01/29/19 1142 01/29/19 1544 01/30/19 0516 01/31/19 0531 01/31/19 1516 02/01/19 0415  NA 135  --  135 137 136 134*  K 3.4*  --  4.3 5.5* 4.8 4.5  CL 96*  --  97* 102 104 101  CO2 25  --  22 25 23 24   GLUCOSE 267*  --  193* 97 204* 167*  BUN 37*  --  44* 47* 45* 45*  CREATININE 1.55*  --  1.65* 1.94* 1.92* 1.87*  CALCIUM 9.0  --  8.6* 8.3* 8.1* 8.1*  MG  --  1.9  --  2.3  --   --   PHOS  --  2.9  --   --   --   --    Liver Function Tests: Recent Labs  Lab 01/29/19 1142 01/30/19 0516  AST 19 13*  ALT 11 10  ALKPHOS 80 69  BILITOT 0.6 0.4  PROT 7.1 6.0*  ALBUMIN 3.1* 2.6*   Recent Labs  Lab 01/29/19 1142  LIPASE 23   No results for input(s): AMMONIA in the last 168 hours. CBC: Recent Labs  Lab 01/29/19 1142 01/30/19 0516 01/31/19 0531 02/01/19 0415  WBC 8.9 8.2 7.5 6.9  NEUTROABS 6.3  --   --   --   HGB 13.1 11.9* 10.4* 10.1*  HCT 41.2 37.3 33.2* 32.7*  MCV 93.8 94.7 96.8 97.9  PLT 229 220 180 164   Cardiac Enzymes: Recent Labs  Lab 01/30/19 0516  CKTOTAL 79   BNP: Invalid input(s): POCBNP CBG: Recent Labs  Lab 01/31/19 1142 01/31/19 1616 01/31/19 2003 02/01/19 0802 02/01/19 1129  GLUCAP 161* 200* 175* 164* 181*    Time coordinating discharge:  36 minutes  Signed:  Orson Eva, DO [facility] Pager: [phone] 02/01/2019, 1:47 PM

## 2019-02-06 ENCOUNTER — Ambulatory Visit: Payer: PPO | Admitting: Physician Assistant

## 2019-02-06 ENCOUNTER — Encounter: Payer: Self-pay | Admitting: Physician Assistant

## 2019-02-06 ENCOUNTER — Other Ambulatory Visit: Payer: Self-pay

## 2019-02-06 VITALS — BP 122/72 | HR 65 | Ht 63.0 in | Wt 178.0 lb

## 2019-02-06 DIAGNOSIS — I255 Ischemic cardiomyopathy: Secondary | ICD-10-CM | POA: Diagnosis not present

## 2019-02-06 DIAGNOSIS — N179 Acute kidney failure, unspecified: Secondary | ICD-10-CM | POA: Diagnosis not present

## 2019-02-06 DIAGNOSIS — E785 Hyperlipidemia, unspecified: Secondary | ICD-10-CM | POA: Diagnosis not present

## 2019-02-06 DIAGNOSIS — I10 Essential (primary) hypertension: Secondary | ICD-10-CM

## 2019-02-06 NOTE — Progress Notes (Signed)
Cardiology Office Note   Date:  02/06/2019   ID:  Caitlin Reed, DOB 04-10-1936, MRN [id number]  PCP:  Lemmie Evens, MD Cardiologist:  Rozann Lesches, MD in hospital 01/30/2019 Rosaria Ferries, PA-C   No chief complaint on file.   History of Present Illness: [name] is a 83 y.o. female with a history of DM2, HTN, HLD, clean cath 2002  Admitted 07/01-07/04 with duodenitis, ARI, dx presumed NICM w/ EF 35%, +WMA, HS trop elevation, ?hx anterior infarct. D/c on ASA, Plavix, statin, bisoprolol. Would use ARB or ARNI instead of Cardizem and HCTZ at f/u if renal function allows and BP improved, consider Lexi MV or cath as outpt  Caitlin Reed presents for cardiology follow up. She is accompanied by her daughter  She had no events prior to onset of her acute illness, no idea if she had a heart attack, or when. Never had chest pain.   She uses a walker at baseline due to R hip and knee issues. No hx of increased DOE, LE edema, orthopnea or PND.  The [facility] came out and her BP was 84/52, HR 46. PCP contacted and advised ER, but BP/HR came up and she was told she did not need to go.   Since d/c, she has not done much. PT came out and gave her some exercises.   RLE has been swollen for months. Had steroid injection and was told the swelling was likely from OA. LLE does not swell except during the day. Denies orthopnea or PND.   Denies hx CP, feels nauseated once in a while.   Has had Lasix once since d/c.has been gaining a pound a day.    Past Medical History:  Diagnosis Date  . Cardiomyopathy ([place]) 01/29/2019   EF 35% by echo  . Essential hypertension   . Mixed hyperlipidemia   . Type 2 diabetes mellitus ([place])     Past Surgical History:  Procedure Laterality Date  . CARDIAC CATHETERIZATION  2002   nl cors, nl EF  . COLONOSCOPY N/A 11/26/2013   Procedure: COLONOSCOPY;  Surgeon: Rogene Houston, MD;  Location: [facility];  Service: Endoscopy;   Laterality: N/A;  915  . ESOPHAGOGASTRODUODENOSCOPY N/A 11/26/2013   Procedure: ESOPHAGOGASTRODUODENOSCOPY (EGD);  Surgeon: Rogene Houston, MD;  Location: [facility];  Service: Endoscopy;  Laterality: N/A;  . EYE SURGERY      Current Outpatient Medications  Medication Sig Dispense Refill  . aspirin 81 MG tablet Take 81 mg by mouth daily.    [name] atorvastatin (LIPITOR) 20 MG tablet Take 1 tablet (20 mg total) by mouth daily at 6 PM. 30 tablet 1  . bisoprolol (ZEBETA) 5 MG tablet Take 0.5 tablets (2.5 mg total) by mouth daily. 30 tablet 1  . calcium carbonate (OS-CAL) 600 MG TABS tablet Take 1,200 mg by mouth daily.     . cholecalciferol (VITAMIN D) 400 UNITS TABS tablet Take 1,000 Units by mouth.    . clopidogrel (PLAVIX) 75 MG tablet Take 1 tablet (75 mg total) by mouth daily. 30 tablet 1  . dorzolamide-timolol (COSOPT) 22.3-6.8 MG/ML ophthalmic solution Place 1 drop into both eyes at bedtime.    . ferrous sulfate (FERROUSUL) 325 (65 FE) MG tablet Take 1 tablet (325 mg total) by mouth daily with breakfast.  0  . furosemide (LASIX) 20 MG tablet Take 20 mg by mouth as needed for fluid.     [name] glimepiride (AMARYL) 1 MG tablet Take  1 tablet by mouth 2 (two) times a day.    . latanoprost (XALATAN) 0.005 % ophthalmic solution Place 1 drop into both eyes at bedtime.    [name] LORazepam (ATIVAN) 0.5 MG tablet Take 0.5 mg by mouth daily as needed for anxiety.     [name] omeprazole (PRILOSEC) 20 MG capsule Take 20 mg by mouth daily.    . pantoprazole (PROTONIX) 40 MG tablet Take 1 tablet (40 mg total) by mouth daily. 30 tablet 1   No current facility-administered medications for this visit.     Allergies:   Patient has no known allergies.    Social History:  The patient  reports that she has never smoked. She has never used smokeless tobacco. She reports that she does not drink alcohol or use drugs.   Family History:  The patient's family history includes Brain cancer in her father; Diabetes Mellitus II in  her mother.  She indicated that her mother is deceased. She indicated that her father is deceased. She indicated that her sister is deceased.    ROS:  Please see the history of present illness. All other systems are reviewed and negative.    PHYSICAL EXAM: VS:  BP 122/72   Pulse 65   Ht 5\' 3"  (1.6 m)   Wt 178 lb (80.7 kg)   BMI 31.53 kg/m  , BMI Body mass index is 31.53 kg/m. GEN: Well nourished, well developed, female in no acute distress HEENT: normal for age  Neck: no JVD, no carotid bruit, no masses Cardiac: RRR; no murmur, no rubs, or gallops Respiratory:  clear to auscultation bilaterally, normal work of breathing GI: soft, nontender, nondistended, + BS MS: no deformity or atrophy; no edema; distal pulses are 2+ in all 4 extremities  Skin: warm and dry, no rash Neuro:  Strength and sensation are intact Psych: euthymic mood, full affect   EKG:  EKG is ordered today. The ekg ordered today demonstrates SR, HR 65, anteroseptal infarct w/ Q waves I, II, AVl, V1-V5, no change from   Echocardiogram 01/29/2019: 1. The left ventricle has a visually estimated ejection fraction of approximately 35%. The cavity size was normal. There is moderately increased left ventricular wall thickness. Left ventricular diastolic Doppler parameters are consistent with impaired  relaxation. 2. There is akinesis of the left ventricular, apical inferior wall. 3. There is akinesis of the left ventricular, mid-apical anteroseptal wall, anterior wall and apical segment. 4. The right ventricle has normal systolic function. The cavity was normal. There is no increase in right ventricular wall thickness. Right ventricular systolic pressure is moderately elevated with an estimated pressure of 47.6 mmHg. 5. The aortic valve is tricuspid. Mild aortic annular calcification noted. 6. The mitral valve is grossly normal. There is mild mitral annular calcification present. 7. The tricuspid valve is grossly  normal. 8. The aortic root is normal in size and structure.  CARDIAC CATH: 12/18/2000 SELECTIVE CORONARY ANGIOGRAPHY: 1. Left main:  Normal. 2. Left anterior descending coronary artery:  Normal. 3. Left circumflex:  Dominant and anomalous:  Normal. 4. Right coronary artery:  Nondominant and normal.  LEFT VENTRICULOGRAPHY:  RAO left ventriculogram was performed using 25 cc of Omnipaque dye at 12 cc/sec.  The overall LV-EF was estimated at greater than 60%, without focal wall motion abnormalities.  DISTAL ABDOMINAL AORTOGRAPHY:  Distal abdominal aortogram was performed using 20 cc of Omnipaque dye at 20 cc/sec.  The renal arteries were widely patent. The infrarenal abdominal aorta and iliac bifurcation were free  of significant atherosclerotic changes.  IMPRESSION:  Ms. Skowron has normal coronary arteries and normal left ventricular function.  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  She had a false positive Cardiolite and noncardiac chest pain.  Recent Labs: 01/29/2019: B Natriuretic Peptide 2,669.0 01/30/2019: ALT 10 01/31/2019: Magnesium 2.3 02/01/2019: BUN 45; Creatinine, Ser 1.87; Hemoglobin 10.1; Platelets 164; Potassium 4.5; Sodium 134  CBC    Component Value Date/Time   WBC 6.9 02/01/2019 0415   RBC 3.34 (L) 02/01/2019 0415   HGB 10.1 (L) 02/01/2019 0415   HCT 32.7 (L) 02/01/2019 0415   PLT 164 02/01/2019 0415   MCV 97.9 02/01/2019 0415   MCH 30.2 02/01/2019 0415   MCHC 30.9 02/01/2019 0415   RDW 12.9 02/01/2019 0415   LYMPHSABS 1.5 01/29/2019 1142   MONOABS 0.9 01/29/2019 1142   EOSABS 0.0 01/29/2019 1142   BASOSABS 0.1 01/29/2019 1142   CMP Latest Ref Rng & Units 02/01/2019 01/31/2019 01/31/2019  Glucose 70 - 99 mg/dL 167(H) 204(H) 97  BUN 8 - 23 mg/dL 45(H) 45(H) 47(H)  Creatinine 0.44 - 1.00 mg/dL 1.87(H) 1.92(H) 1.94(H)  Sodium 135 - 145 mmol/L 134(L) 136 137  Potassium 3.5 - 5.1 mmol/L 4.5 4.8 5.5(H)  Chloride  98 - 111 mmol/L 101 104 102  CO2 22 - 32 mmol/L 24 23 25   Calcium 8.9 - 10.3 mg/dL 8.1(L) 8.1(L) 8.3(L)  Total Protein 6.5 - 8.1 g/dL - - -  Total Bilirubin 0.3 - 1.2 mg/dL - - -  Alkaline Phos 38 - 126 U/L - - -  AST 15 - 41 U/L - - -  ALT 0 - 44 U/L - - -     Lipid Panel Lab Results  Component Value Date   CHOL 141 01/31/2019   HDL 37 (L) 01/31/2019   LDLCALC 79 01/31/2019   TRIG 124 01/31/2019   CHOLHDL 3.8 01/31/2019      Wt Readings from Last 3 Encounters:  02/06/19 178 lb (80.7 kg)  01/31/19 169 lb 12.1 oz (77 kg)  04/03/18 165 lb (74.8 kg)     Other studies Reviewed: Additional studies/ records that were reviewed today include: hospital records and testing.  ASSESSMENT AND PLAN:  1.  Acute on chronic diastolic CHF - she has been gaining weight steadily since d/c, wt is up 9 lbs in less than a week. - reinforced need for low Na diet, watch fluids - take Lasix daily for 4 days, then prn for weight gain of 3 lbs day/5 lbs week - ck BMET next week in [place] - f/u in [place] in a week or so to make sure volume is improved and stable  2.?ICM - did not tolerate bisoprolol 2.5 mg w/ bradycardia into the 40s - follow BP w/ diuresis, at f/u consider starting CHF rx but w/ abnl renal function and need for diuresis, hold off for now.  - consider ischemic testing, but decision is difficult w/ poor renal function - would prefer cath, if possible, once renal function stabilized  3. CKD III-IV - no labs in system prior to this admission - Cr on admit 1.55>>1.94 w/ diuresis>>1.87 at d/c - ck next week  4. Dyslipidemia - was on Pravachol prior to admission>>changed to Lipitor - goal LDL < 70   Current medicines are reviewed at length with the patient today.  The patient has concerns regarding medicines. Concerns were addressed  The following changes have been made:  Increase Lasix  Labs/  tests ordered today include:   Orders Placed This Encounter  Procedures   . Basic metabolic panel  . EKG 12-Lead     Disposition:   FU with Rozann Lesches, MD in [address] Rosaria Ferries, PA-C  02/06/2019 [place] Phone: [phone]; Fax: [phone]

## 2019-02-06 NOTE — Patient Instructions (Signed)
Medication Instructions:  STOP Bisoprolol  Take Lasix daily for 4 days; then decrease to as needed for weight gain of 3 lbs in one night or 5 lbs in one week.  If you need a refill on your cardiac medications before your next appointment, please call your pharmacy.   Lab work: Your physician recommends that you return for lab work in [place]: BMET (to check kidney function)  If you have labs (blood work) drawn today and your tests are completely normal, you will receive your results only by: [name] MyChart Message (if you have MyChart) OR . A paper copy in the mail If you have any lab test that is abnormal or we need to change your treatment, we will call you to review the results.   Follow-Up: At [facility], you and your health needs are our priority.  As part of our continuing mission to provide you with exceptional heart care, we have created designated Provider Care Teams.  These Care Teams include your primary Cardiologist (physician) and Advanced Practice Providers (APPs -  Physician Assistants and Nurse Practitioners) who all work together to provide you with the care you need, when you need it. . You have been scheduled for a follow-up appointment with Caitlin Reed, [place] on Tuesday, 02/18/19 at 4:00 PM.   Any Other Special Instructions Will Be Listed Below (If Applicable).  Limit sodium intake to 500 mg per meal.  Limit Fluid intake to 1 & 1/2 quarts or Liters per day.  Your physician recommends that you weigh, daily, at the same time every day, and in the same amount of clothing. Please record your daily weights and bring to your next appointment.   Heart-Healthy Eating Plan Heart-healthy meal planning includes:  Eating less unhealthy fats.  Eating more healthy fats.  Making other changes in your diet. Talk with your doctor or a diet specialist (dietitian) to create an eating plan that is right for you. What is my plan? Your doctor may recommend an eating plan that  includes:  Total fat: ______% or less of total calories a day.  Saturated fat: ______% or less of total calories a day.  Cholesterol: less than _________mg a day. What are tips for following this plan? Cooking Avoid frying your food. Try to bake, boil, grill, or broil it instead. You can also reduce fat by:  Removing the skin from poultry.  Removing all visible fats from meats.  Steaming vegetables in water or broth. Meal planning   At meals, divide your plate into four equal parts: ? Fill one-half of your plate with vegetables and green salads. ? Fill one-fourth of your plate with whole grains. ? Fill one-fourth of your plate with lean protein foods.  Eat 4-5 servings of vegetables per day. A serving of vegetables is: ? 1 cup of raw or cooked vegetables. ? 2 cups of raw leafy greens.  Eat 4-5 servings of fruit per day. A serving of fruit is: ? 1 medium whole fruit. ?  cup of dried fruit. ?  cup of fresh, frozen, or canned fruit. ?  cup of 100% fruit juice.  Eat more foods that have soluble fiber. These are apples, broccoli, carrots, beans, peas, and barley. Try to get 20-30 g of fiber per day.  Eat 4-5 servings of nuts, legumes, and seeds per week: ? 1 serving of dried beans or legumes equals  cup after being cooked. ? 1 serving of nuts is  cup. ? 1 serving of seeds equals 1  tablespoon. General information  Eat more home-cooked food. Eat less restaurant, buffet, and fast food.  Limit or avoid alcohol.  Limit foods that are high in starch and sugar.  Avoid fried foods.  Lose weight if you are overweight.  Keep track of how much salt (sodium) you eat. This is important if you have high blood pressure. Ask your doctor to tell you more about this.  Try to add vegetarian meals each week. Fats  Choose healthy fats. These include olive oil and canola oil, flaxseeds, walnuts, almonds, and seeds.  Eat more omega-3 fats. These include salmon, mackerel, sardines,  tuna, flaxseed oil, and ground flaxseeds. Try to eat fish at least 2 times each week.  Check food labels. Avoid foods with trans fats or high amounts of saturated fat.  Limit saturated fats. ? These are often found in animal products, such as meats, butter, and cream. ? These are also found in plant foods, such as palm oil, palm kernel oil, and coconut oil.  Avoid foods with partially hydrogenated oils in them. These have trans fats. Examples are stick margarine, some tub margarines, cookies, crackers, and other baked goods. What foods can I eat? Fruits All fresh, canned (in natural juice), or frozen fruits. Vegetables Fresh or frozen vegetables (raw, steamed, roasted, or grilled). Green salads. Grains Most grains. Choose whole wheat and whole grains most of the time. Rice and pasta, including brown rice and pastas made with whole wheat. Meats and other proteins Lean, well-trimmed beef, veal, pork, and lamb. Chicken and [place] without skin. All fish and shellfish. Wild duck, rabbit, pheasant, and venison. Egg whites or low-cholesterol egg substitutes. Dried beans, peas, lentils, and tofu. Seeds and most nuts. Dairy Low-fat or nonfat cheeses, including ricotta and mozzarella. Skim or 1% milk that is liquid, powdered, or evaporated. Buttermilk that is made with low-fat milk. Nonfat or low-fat yogurt. Fats and oils Non-hydrogenated (trans-free) margarines. Vegetable oils, including soybean, sesame, sunflower, olive, peanut, safflower, corn, canola, and cottonseed. Salad dressings or mayonnaise made with a vegetable oil. Beverages Mineral water. Coffee and tea. Diet carbonated beverages. Sweets and desserts Sherbet, gelatin, and fruit ice. Small amounts of dark chocolate. Limit all sweets and desserts. Seasonings and condiments All seasonings and condiments. The items listed above may not be a complete list of foods and drinks you can eat. Contact a dietitian for more options. What foods  should I avoid? Fruits Canned fruit in heavy syrup. Fruit in cream or butter sauce. Fried fruit. Limit coconut. Vegetables Vegetables cooked in cheese, cream, or butter sauce. Fried vegetables. Grains Breads that are made with saturated or trans fats, oils, or whole milk. Croissants. Sweet rolls. Donuts. High-fat crackers, such as cheese crackers. Meats and other proteins Fatty meats, such as hot dogs, ribs, sausage, bacon, rib-eye roast or steak. High-fat deli meats, such as salami and bologna. Caviar. Domestic duck and goose. Organ meats, such as liver. Dairy Cream, sour cream, cream cheese, and creamed cottage cheese. Whole-milk cheeses. Whole or 2% milk that is liquid, evaporated, or condensed. Whole buttermilk. Cream sauce or high-fat cheese sauce. Yogurt that is made from whole milk. Fats and oils Meat fat, or shortening. Cocoa butter, hydrogenated oils, palm oil, coconut oil, palm kernel oil. Solid fats and shortenings, including bacon fat, salt pork, lard, and butter. Nondairy cream substitutes. Salad dressings with cheese or sour cream. Beverages Regular sodas and juice drinks with added sugar. Sweets and desserts Frosting. Pudding. Cookies. Cakes. Pies. Milk chocolate or white chocolate. Buttered syrups.  Full-fat ice cream or ice cream drinks. The items listed above may not be a complete list of foods and drinks to avoid. Contact a dietitian for more information. Summary  Heart-healthy meal planning includes eating less unhealthy fats, eating more healthy fats, and making other changes in your diet.  Eat a balanced diet. This includes fruits and vegetables, low-fat or nonfat dairy, lean protein, nuts and legumes, whole grains, and heart-healthy oils and fats. This information is not intended to replace advice given to you by your health care provider. Make sure you discuss any questions you have with your health care provider. Document Released: 01/16/2012 Document Revised:  09/20/2017 Document Reviewed: 08/24/2017 Elsevier Patient Education  2020 [employer].

## 2019-02-12 DIAGNOSIS — E782 Mixed hyperlipidemia: Secondary | ICD-10-CM | POA: Diagnosis not present

## 2019-02-12 DIAGNOSIS — N179 Acute kidney failure, unspecified: Secondary | ICD-10-CM | POA: Diagnosis not present

## 2019-02-12 DIAGNOSIS — I255 Ischemic cardiomyopathy: Secondary | ICD-10-CM | POA: Diagnosis not present

## 2019-02-12 DIAGNOSIS — I10 Essential (primary) hypertension: Secondary | ICD-10-CM | POA: Diagnosis not present

## 2019-02-13 LAB — BASIC METABOLIC PANEL
BUN/Creatinine Ratio: 12 (calc) (ref 6–22)
BUN: 20 mg/dL (ref 7–25)
CO2: 28 mmol/L (ref 20–32)
Calcium: 9 mg/dL (ref 8.6–10.4)
Chloride: 107 mmol/L (ref 98–110)
Creat: 1.68 mg/dL — ABNORMAL HIGH (ref 0.60–0.88)
Glucose, Bld: 78 mg/dL (ref 65–139)
Potassium: 5.2 mmol/L (ref 3.5–5.3)
Sodium: 143 mmol/L (ref 135–146)

## 2019-02-14 DIAGNOSIS — I509 Heart failure, unspecified: Secondary | ICD-10-CM | POA: Diagnosis not present

## 2019-02-14 DIAGNOSIS — Z794 Long term (current) use of insulin: Secondary | ICD-10-CM | POA: Diagnosis not present

## 2019-02-14 DIAGNOSIS — Z9181 History of falling: Secondary | ICD-10-CM | POA: Diagnosis not present

## 2019-02-14 DIAGNOSIS — I255 Ischemic cardiomyopathy: Secondary | ICD-10-CM | POA: Diagnosis not present

## 2019-02-14 DIAGNOSIS — E782 Mixed hyperlipidemia: Secondary | ICD-10-CM | POA: Diagnosis not present

## 2019-02-14 DIAGNOSIS — Z7982 Long term (current) use of aspirin: Secondary | ICD-10-CM | POA: Diagnosis not present

## 2019-02-14 DIAGNOSIS — I429 Cardiomyopathy, unspecified: Secondary | ICD-10-CM | POA: Diagnosis not present

## 2019-02-14 DIAGNOSIS — M6281 Muscle weakness (generalized): Secondary | ICD-10-CM | POA: Diagnosis not present

## 2019-02-14 DIAGNOSIS — R600 Localized edema: Secondary | ICD-10-CM | POA: Diagnosis not present

## 2019-02-14 DIAGNOSIS — D649 Anemia, unspecified: Secondary | ICD-10-CM | POA: Diagnosis not present

## 2019-02-14 DIAGNOSIS — E119 Type 2 diabetes mellitus without complications: Secondary | ICD-10-CM | POA: Diagnosis not present

## 2019-02-14 DIAGNOSIS — I214 Non-ST elevation (NSTEMI) myocardial infarction: Secondary | ICD-10-CM | POA: Diagnosis not present

## 2019-02-14 DIAGNOSIS — H409 Unspecified glaucoma: Secondary | ICD-10-CM | POA: Diagnosis not present

## 2019-02-14 DIAGNOSIS — I10 Essential (primary) hypertension: Secondary | ICD-10-CM | POA: Diagnosis not present

## 2019-02-14 DIAGNOSIS — K529 Noninfective gastroenteritis and colitis, unspecified: Secondary | ICD-10-CM | POA: Diagnosis not present

## 2019-02-14 DIAGNOSIS — N183 Chronic kidney disease, stage 3 (moderate): Secondary | ICD-10-CM | POA: Diagnosis not present

## 2019-02-18 ENCOUNTER — Encounter: Payer: Self-pay | Admitting: *Deleted

## 2019-02-18 ENCOUNTER — Encounter: Payer: Self-pay | Admitting: Student

## 2019-02-18 ENCOUNTER — Ambulatory Visit: Payer: PPO | Admitting: Student

## 2019-02-18 ENCOUNTER — Other Ambulatory Visit: Payer: Self-pay

## 2019-02-18 VITALS — BP 138/60 | HR 76 | Temp 97.1°F | Ht 63.0 in | Wt 184.0 lb

## 2019-02-18 DIAGNOSIS — I10 Essential (primary) hypertension: Secondary | ICD-10-CM

## 2019-02-18 DIAGNOSIS — Z79899 Other long term (current) drug therapy: Secondary | ICD-10-CM | POA: Diagnosis not present

## 2019-02-18 DIAGNOSIS — I5042 Chronic combined systolic (congestive) and diastolic (congestive) heart failure: Secondary | ICD-10-CM | POA: Diagnosis not present

## 2019-02-18 DIAGNOSIS — N183 Chronic kidney disease, stage 3 unspecified: Secondary | ICD-10-CM

## 2019-02-18 DIAGNOSIS — E785 Hyperlipidemia, unspecified: Secondary | ICD-10-CM

## 2019-02-18 DIAGNOSIS — I429 Cardiomyopathy, unspecified: Secondary | ICD-10-CM | POA: Diagnosis not present

## 2019-02-18 NOTE — Progress Notes (Signed)
Cardiology Office Note    Date:  02/18/2019   ID:  Caitlin Reed, DOB March 10, 1936, MRN [id number]  PCP:  Lemmie Evens, MD  Cardiologist: Rozann Lesches, MD    Chief Complaint  Patient presents with   Follow-up    2 week visit    History of Present Illness:    Caitlin Reed is a 83 y.o. female with past medical history of newly diagnosed cardiomyopathy (EF 35% by echo in 01/2019), HTN, Type 2 DM, and HLD who presents to the office today for a 2-week follow-up visit.   She was admitted to [facility] earlier this month for evaluation of nausea and fatigue.  Her initial EKG was concerning for a recent anterior infarct with initial HS Troponin elevated to 1083 and trending down to 761. Echocardiogram showed a reduced EF of 35% with no prior imaging available for comparison. CT imaging during admission also showed evidence of duodenitis including nausea and anorexia. Given that she denied any recent chest pain or dyspnea on exertion, further ischemic evaluation was not pursued during admission and it was recommended to optimize medical therapy with consideration of an outpatient Lexiscan Myoview.  She did follow-up with Rosaria Ferries, PA-C on 02/06/2019 and denied any recent chest pain or dyspnea on exertion. She had been experiencing worsening lower extremity edema and had only taken Lasix once since discharge. It was recommended she take Lasix 20 mg daily for 4 days then as needed. Bisoprolol was discontinued as she experienced bradycardia with this as heart rate in the 40's and SBP had dropped into the 80's.  Close follow-up was arranged to discuss further ischemic evaluation.  In talking with the patient and her daughter today, she continues to deny any recent chest pain or dyspnea on exertion. No recent orthopnea, PND, or palpitations. She has continued to experience lower extremity edema and weight has been variable on her home scales.  This previously peaked at 181 lbs but  was 179 lbs today. Reports a previous baseline of 175 - 178 lbs. Says her PCP instructed her to take Lasix daily at her visit on Friday until her appointment today. She did not tolerate compression stockings in the past. Does not consume a large amount of fluids. She does not add additional salt to her food but does get take-out from local restaurants.    Past Medical History:  Diagnosis Date   Cardiomyopathy ([place]) 01/29/2019   EF 35% by echo   Essential hypertension    Mixed hyperlipidemia    Type 2 diabetes mellitus ([place])     Past Surgical History:  Procedure Laterality Date   CARDIAC CATHETERIZATION  2002   nl cors, nl EF   COLONOSCOPY N/A 11/26/2013   Procedure: COLONOSCOPY;  Surgeon: Rogene Houston, MD;  Location: [facility];  Service: Endoscopy;  Laterality: N/A;  915   ESOPHAGOGASTRODUODENOSCOPY N/A 11/26/2013   Procedure: ESOPHAGOGASTRODUODENOSCOPY (EGD);  Surgeon: Rogene Houston, MD;  Location: [facility];  Service: Endoscopy;  Laterality: N/A;   EYE SURGERY      Current Medications: Outpatient Medications Prior to Visit  Medication Sig Dispense Refill   aspirin 81 MG tablet Take 81 mg by mouth daily.     atorvastatin (LIPITOR) 20 MG tablet Take 1 tablet (20 mg total) by mouth daily at 6 PM. 30 tablet 1   calcium carbonate (OS-CAL) 600 MG TABS tablet Take 1,200 mg by mouth daily.      cholecalciferol (VITAMIN D) 400 UNITS TABS  tablet Take 1,000 Units by mouth.     clopidogrel (PLAVIX) 75 MG tablet Take 1 tablet (75 mg total) by mouth daily. 30 tablet 1   dorzolamide-timolol (COSOPT) 22.3-6.8 MG/ML ophthalmic solution Place 1 drop into both eyes at bedtime.     ferrous sulfate (FERROUSUL) 325 (65 FE) MG tablet Take 1 tablet (325 mg total) by mouth daily with breakfast.  0   furosemide (LASIX) 20 MG tablet Take 20 mg by mouth as needed for fluid.      glimepiride (AMARYL) 1 MG tablet Take 1 tablet by mouth 2 (two) times a day.     latanoprost  (XALATAN) 0.005 % ophthalmic solution Place 1 drop into both eyes at bedtime.     LORazepam (ATIVAN) 0.5 MG tablet Take 0.5 mg by mouth daily as needed for anxiety.      omeprazole (PRILOSEC) 20 MG capsule Take 20 mg by mouth daily.     pantoprazole (PROTONIX) 40 MG tablet Take 1 tablet (40 mg total) by mouth daily. 30 tablet 1   No facility-administered medications prior to visit.      Allergies:   Patient has no known allergies.   Social History   Socioeconomic History   Marital status: Married    Spouse name: Not on file   Number of children: Not on file   Years of education: Not on file   Highest education level: Not on file  Occupational History   Not on file  Social Needs   Financial resource strain: Not on file   Food insecurity    Worry: Not on file    Inability: Not on file   Transportation needs    Medical: Not on file    Non-medical: Not on file  Tobacco Use   Smoking status: Never Smoker   Smokeless tobacco: Never Used  Substance and Sexual Activity   Alcohol use: No   Drug use: No   Sexual activity: Not on file  Lifestyle   Physical activity    Days per week: Not on file    Minutes per session: Not on file   Stress: Not on file  Relationships   Social connections    Talks on phone: Not on file    Gets together: Not on file    Attends religious service: Not on file    Active member of club or organization: Not on file    Attends meetings of clubs or organizations: Not on file    Relationship status: Not on file  Other Topics Concern   Not on file  Social History Narrative   Not on file     Family History:  The patient's family history includes Brain cancer in her father; Diabetes Mellitus II in her mother.   Review of Systems:   Please see the history of present illness.     General:  No chills, fever, night sweats or weight changes.  Cardiovascular:  No chest pain, dyspnea on exertion, orthopnea, palpitations, paroxysmal  nocturnal dyspnea. Positive for lower extremity edema.  Dermatological: No rash, lesions/masses Respiratory: No cough, dyspnea Urologic: No hematuria, dysuria Abdominal:   No nausea, vomiting, diarrhea, bright red blood per rectum, melena, or hematemesis Neurologic:  No visual changes, wkns, changes in mental status. All other systems reviewed and are otherwise negative except as noted above.   Physical Exam:    VS:  BP 138/60    Pulse 76    Temp (!) 97.1 F (36.2 C)    Ht 5'  3" (1.6 m)    Wt 184 lb (83.5 kg)    BMI 32.59 kg/m    General: Well developed, well nourished,female appearing in no acute distress. Head: Normocephalic, atraumatic, sclera non-icteric, no xanthomas, nares are without discharge.  Neck: No carotid bruits. JVD not elevated.  Lungs: Respirations regular and unlabored, without wheezes or rales.  Heart: Regular rate and rhythm. No S3 or S4.  No murmur, no rubs, or gallops appreciated. Abdomen: Soft, non-tender, non-distended with normoactive bowel sounds. No hepatomegaly. No rebound/guarding. No obvious abdominal masses. Msk:  Strength and tone appear normal for age. No joint deformities or effusions. Extremities: No clubbing or cyanosis. 1+ pitting edema up to mid-shins bilaterally.  Distal pedal pulses are 2+ bilaterally. Neuro: Alert and oriented X 3. Moves all extremities spontaneously. No focal deficits noted. Psych:  Responds to questions appropriately with a normal affect. Skin: No rashes or lesions noted  Wt Readings from Last 3 Encounters:  02/18/19 184 lb (83.5 kg)  02/06/19 178 lb (80.7 kg)  01/31/19 169 lb 12.1 oz (77 kg)     Studies/Labs Reviewed:   EKG:  EKG is not ordered today.   Recent Labs: 01/29/2019: B Natriuretic Peptide 2,669.0 01/30/2019: ALT 10 01/31/2019: Magnesium 2.3 02/01/2019: Hemoglobin 10.1; Platelets 164 02/12/2019: BUN 20; Creat 1.68; Potassium 5.2; Sodium 143   Lipid Panel    Component Value Date/Time   CHOL 141 01/31/2019 0531     TRIG 124 01/31/2019 0531   HDL 37 (L) 01/31/2019 0531   CHOLHDL 3.8 01/31/2019 0531   VLDL 25 01/31/2019 0531   LDLCALC 79 01/31/2019 0531    Additional studies/ records that were reviewed today include:   Echocardiogram: 01/29/2019 IMPRESSIONS    1. The left ventricle has a visually estimated ejection fraction of approximately 35%. The cavity size was normal. There is moderately increased left ventricular wall thickness. Left ventricular diastolic Doppler parameters are consistent with impaired  relaxation.  2. There is akinesis of the left ventricular, apical inferior wall.  3. There is akinesis of the left ventricular, mid-apical anteroseptal wall, anterior wall and apical segment.  4. The right ventricle has normal systolic function. The cavity was normal. There is no increase in right ventricular wall thickness. Right ventricular systolic pressure is moderately elevated with an estimated pressure of 47.6 mmHg.  5. The aortic valve is tricuspid. Mild aortic annular calcification noted.  6. The mitral valve is grossly normal. There is mild mitral annular calcification present.  7. The tricuspid valve is grossly normal.  8. The aortic root is normal in size and structure.  Assessment:    1. Chronic combined systolic and diastolic heart failure (HCC)   2. Cardiomyopathy, unspecified type ([place])   3. Essential hypertension   4. Hyperlipidemia LDL goal <70   5. CKD (chronic kidney disease) stage 3, GFR 30-59 ml/min (HCC)   6. Medication management      Plan:   In order of problems listed above:  1. Chronic Combined Systolic and Diastolic CHF/Cardiomyopathy - Recent echocardiogram showed a reduced EF of 35% and medical therapy was initially recommended given no recent anginal symptoms. She continues to deny any recent dyspnea on exertion or chest pain but has experienced worsening lower extremity edema.  Will plan to continue with Lasix 20 mg daily instead of just using as  needed. Recheck BMET in 2-3 weeks. She was intolerant to low-dose Bisoprolol due to bradycardia with HR in the 40's. Pending renal function with repeat BMET, would plan to  add Losartan 12.5mg  daily.  - discussed further ischemic evaluation with Dr. Domenic Polite and given her asymptomatic state and renal insufficiency, will plan to proceed with a Lexiscan Myoview for initial ischemic assessment. If high-risk, would then consider catheterization with plans for pre-procedure hydration and no LV gram. This was reviewed with the patient and her daughter today.   2. HTN - BP is well controlled at 138/60 during today's visit but she was previously having episodes of hypotension with low-dose Bisoprolol. Given the recent resumption of daily Lasix, will recheck her BMET in 2 to 3 weeks. Pending recheck of creatinine and follow-up of BP, will consider the addition of Losartan 12.5 mg daily if BP allows and renal function remains stable.  3. HLD - FLP earlier this month showed total cholesterol 141, triglycerides 124, HDL 37, and LDL 79. She will need repeat FLP and LFT's at her next visit if not obtained by her PCP in the interim given her recent switch to Atorvastatin. Continue Atorvastatin 20mg  daily.   4. Stage 3 CKD - creatinine peaked at 1.94 during admission, improved to 1.68 when rechecked on 02/12/2019. Repeat BMET in 2 weeks.    Medication Adjustments/Labs and Tests Ordered: Current medicines are reviewed at length with the patient today.  Concerns regarding medicines are outlined above.  Medication changes, Labs and Tests ordered today are listed in the Patient Instructions below. Patient Instructions  Medication Instructions:  Your physician recommends that you continue on your current medications as directed. Please refer to the Current Medication list given to you today.  If you need a refill on your cardiac medications before your next appointment, please call your pharmacy.   Lab work: Your  physician recommends that you return for lab work in: 2 Weeks   If you have labs (blood work) drawn today and your tests are completely normal, you will receive your results only by:  [place] (if you have MyChart) OR  A paper copy in the mail If you have any lab test that is abnormal or we need to change your treatment, we will call you to review the results.  Testing/Procedures: Your physician has requested that you have a lexiscan myoview. For further information please visit [email or web address]. Please follow instruction sheet, as given.  Follow-Up: At [facility], you and your health needs are our priority.  As part of our continuing mission to provide you with exceptional heart care, we have created designated Provider Care Teams.  These Care Teams include your primary Cardiologist (physician) and Advanced Practice Providers (APPs -  Physician Assistants and Nurse Practitioners) who all work together to provide you with the care you need, when you need it. You will need a follow up appointment in 6-8 weeks.  Please call our office 2 months in advance to schedule this appointment.  You may see Rozann Lesches, MD or one of the following Advanced Practice Providers on your designated Care Team:   Bernerd Pho, PA-C ([place])  Ermalinda Barrios, PA-C [facility])  Any Other Special Instructions Will Be Listed Below (If Applicable). Thank you for choosing [facility]!    Signed, Erma Heritage, PA-C  02/18/2019 1:06 PM    [facility] [address] [place], [place] [address] Phone: [phone] Fax: [phone]

## 2019-02-18 NOTE — Patient Instructions (Signed)
Medication Instructions:  Your physician recommends that you continue on your current medications as directed. Please refer to the Current Medication list given to you today.  If you need a refill on your cardiac medications before your next appointment, please call your pharmacy.   Lab work: Your physician recommends that you return for lab work in: 2 Weeks   If you have labs (blood work) drawn today and your tests are completely normal, you will receive your results only by: [name] MyChart Message (if you have MyChart) OR . A paper copy in the mail If you have any lab test that is abnormal or we need to change your treatment, we will call you to review the results.  Testing/Procedures: Your physician has requested that you have a lexiscan myoview. For further information please visit [email or web address]. Please follow instruction sheet, as given.    Follow-Up: At [facility], you and your health needs are our priority.  As part of our continuing mission to provide you with exceptional heart care, we have created designated Provider Care Teams.  These Care Teams include your primary Cardiologist (physician) and Advanced Practice Providers (APPs -  Physician Assistants and Nurse Practitioners) who all work together to provide you with the care you need, when you need it. You will need a follow up appointment in 6-8 weeks.  Please call our office 2 months in advance to schedule this appointment.  You may see Rozann Lesches, MD or one of the following Advanced Practice Providers on your designated Care Team:   Bernerd Pho, PA-C [facility]) . Ermalinda Barrios, PA-C ([place])  Any Other Special Instructions Will Be Listed Below (If Applicable). Thank you for choosing [facility]!

## 2019-03-03 DIAGNOSIS — E1129 Type 2 diabetes mellitus with other diabetic kidney complication: Secondary | ICD-10-CM | POA: Diagnosis not present

## 2019-03-03 DIAGNOSIS — Z794 Long term (current) use of insulin: Secondary | ICD-10-CM | POA: Diagnosis not present

## 2019-03-04 ENCOUNTER — Other Ambulatory Visit: Payer: Self-pay

## 2019-03-04 ENCOUNTER — Encounter (HOSPITAL_BASED_OUTPATIENT_CLINIC_OR_DEPARTMENT_OTHER)
Admission: RE | Admit: 2019-03-04 | Discharge: 2019-03-04 | Disposition: A | Discharge status: Home / Self Care | Payer: PPO | Source: Ambulatory Visit | Attending: Student | Admitting: Student

## 2019-03-04 ENCOUNTER — Encounter (HOSPITAL_COMMUNITY): Payer: Self-pay

## 2019-03-04 ENCOUNTER — Encounter (HOSPITAL_COMMUNITY)
Admission: RE | Admit: 2019-03-04 | Discharge: 2019-03-04 | Disposition: A | Discharge status: Home / Self Care | Payer: PPO | Source: Ambulatory Visit | Attending: Student | Admitting: Student

## 2019-03-04 DIAGNOSIS — I5042 Chronic combined systolic (congestive) and diastolic (congestive) heart failure: Secondary | ICD-10-CM | POA: Insufficient documentation

## 2019-03-04 DIAGNOSIS — I429 Cardiomyopathy, unspecified: Secondary | ICD-10-CM | POA: Diagnosis not present

## 2019-03-04 LAB — NM MYOCAR MULTI W/SPECT W/WALL MOTION / EF
LV dias vol: 76 mL (ref 46–106)
LV sys vol: 30 mL
Peak HR: 86 {beats}/min
RATE: 0.34
Rest HR: 82 {beats}/min
SDS: 2
SRS: 0
SSS: 2
TID: 0.96

## 2019-03-04 MED ORDER — TECHNETIUM TC 99M TETROFOSMIN IV KIT
10.0000 | PACK | Freq: Once | INTRAVENOUS | Status: AC | PRN
  Administered 2019-03-04: 10:00:00 10.3 via INTRAVENOUS

## 2019-03-04 MED ORDER — REGADENOSON 0.4 MG/5ML IV SOLN
INTRAVENOUS | Status: AC
  Administered 2019-03-04: 11:00:00 0.4 mg via INTRAVENOUS
  Filled 2019-03-04: qty 5

## 2019-03-04 MED ORDER — TECHNETIUM TC 99M TETROFOSMIN IV KIT
30.0000 | PACK | Freq: Once | INTRAVENOUS | Status: AC | PRN
  Administered 2019-03-04: 31.8 via INTRAVENOUS

## 2019-03-04 MED ORDER — SODIUM CHLORIDE 0.9% FLUSH
INTRAVENOUS | Status: AC
  Administered 2019-03-04: 10 mL via INTRAVENOUS
  Filled 2019-03-04: qty 10

## 2019-03-11 DIAGNOSIS — Z79899 Other long term (current) drug therapy: Secondary | ICD-10-CM | POA: Diagnosis not present

## 2019-03-11 LAB — BASIC METABOLIC PANEL
BUN/Creatinine Ratio: 13 (calc) (ref 6–22)
BUN: 18 mg/dL (ref 7–25)
CO2: 22 mmol/L (ref 20–32)
Calcium: 9.1 mg/dL (ref 8.6–10.4)
Chloride: 106 mmol/L (ref 98–110)
Creat: 1.35 mg/dL — ABNORMAL HIGH (ref 0.60–0.88)
Glucose, Bld: 150 mg/dL — ABNORMAL HIGH (ref 65–139)
Potassium: 4.6 mmol/L (ref 3.5–5.3)
Sodium: 142 mmol/L (ref 135–146)

## 2019-04-03 DIAGNOSIS — Z794 Long term (current) use of insulin: Secondary | ICD-10-CM | POA: Diagnosis not present

## 2019-04-03 DIAGNOSIS — E1129 Type 2 diabetes mellitus with other diabetic kidney complication: Secondary | ICD-10-CM | POA: Diagnosis not present

## 2019-04-15 DIAGNOSIS — B351 Tinea unguium: Secondary | ICD-10-CM | POA: Diagnosis not present

## 2019-04-15 DIAGNOSIS — L851 Acquired keratosis [keratoderma] palmaris et plantaris: Secondary | ICD-10-CM | POA: Diagnosis not present

## 2019-04-15 DIAGNOSIS — E1142 Type 2 diabetes mellitus with diabetic polyneuropathy: Secondary | ICD-10-CM | POA: Diagnosis not present

## 2019-05-01 ENCOUNTER — Telehealth: Payer: Self-pay | Admitting: Cardiology

## 2019-05-01 NOTE — Telephone Encounter (Signed)

## 2019-05-03 DIAGNOSIS — Z794 Long term (current) use of insulin: Secondary | ICD-10-CM | POA: Diagnosis not present

## 2019-05-03 DIAGNOSIS — E1129 Type 2 diabetes mellitus with other diabetic kidney complication: Secondary | ICD-10-CM | POA: Diagnosis not present

## 2019-05-05 ENCOUNTER — Encounter: Payer: Self-pay | Admitting: Cardiology

## 2019-05-05 ENCOUNTER — Telehealth (INDEPENDENT_AMBULATORY_CARE_PROVIDER_SITE_OTHER): Payer: PPO | Admitting: Cardiology

## 2019-05-05 VITALS — Ht 63.0 in | Wt 164.0 lb

## 2019-05-05 DIAGNOSIS — E782 Mixed hyperlipidemia: Secondary | ICD-10-CM

## 2019-05-05 DIAGNOSIS — I429 Cardiomyopathy, unspecified: Secondary | ICD-10-CM | POA: Diagnosis not present

## 2019-05-05 DIAGNOSIS — N1832 Chronic kidney disease, stage 3b: Secondary | ICD-10-CM

## 2019-05-05 DIAGNOSIS — I10 Essential (primary) hypertension: Secondary | ICD-10-CM

## 2019-05-05 NOTE — Patient Instructions (Signed)
Medication Instructions: Your physician recommends that you continue on your current medications as directed. Please refer to the Current Medication list given to you today.   Labwork: BMET due  Procedures/Testing: Your physician has requested that you have an echocardiogram. Echocardiography is a painless test that uses sound waves to create images of your heart. It provides your doctor with information about the size and shape of your heart and how well your heart's chambers and valves are working. This procedure takes approximately one hour. There are no restrictions for this procedure.    Follow-Up: 6 weeks with Bernerd Pho, PA-C  Any Additional Special Instructions Will Be Listed Below (If Applicable).  Patient needs to sign DPR  To add daughter   If you need a refill on your cardiac medications before your next appointment, please call your pharmacy.     Thank you for choosing [facility] !

## 2019-05-05 NOTE — Progress Notes (Signed)
Virtual Visit via Telephone Note   This visit type was conducted due to national recommendations for restrictions regarding the COVID-19 Pandemic (e.g. social distancing) in an effort to limit this patient's exposure and mitigate transmission in our community.  Due to her co-morbid illnesses, this patient is at least at moderate risk for complications without adequate follow up.  This format is felt to be most appropriate for this patient at this time.  The patient did not have access to video technology/had technical difficulties with video requiring transitioning to audio format only (telephone).  All issues noted in this document were discussed and addressed.  No physical exam could be performed with this format.  Please refer to the patient's chart for her  consent to telehealth for [facility].   Date:  05/05/2019   ID:  Caitlin Reed, DOB Jul 16, 1936, MRN [id number]  Patient Location: Home Provider Location: Home  PCP:  Lemmie Evens, MD  Cardiologist:  Rozann Lesches, MD Electrophysiologist:  None   Evaluation Performed:  Follow-Up Visit  Chief Complaint:   Cardiac follow-up  History of Present Illness:    Caitlin Reed is an 83 y.o. female last seen by Ms. Strader PA-C in July.  We spoke by phone today.  I also talked with her daughter.  States that she has been doing well, no chest pain or exertional shortness of breath.  She has lost nearly 20 pounds since July, continues on low-dose Lasix.  Cardiac testing is reviewed below.  She has a history of cardiomyopathy documented by echocardiogram in July, however follow-up Myoview in August showed no significant ischemia and normalization of LVEF.  We discussed these results today.  I reviewed her medications which are outlined below.  She is on aspirin, Plavix, Lipitor, and Lasix at 10 mg daily.  She did not tolerate bisoprolol previously due to low blood pressure.  She did not have a way to check her vital signs  today.  The patient does not have symptoms concerning for COVID-19 infection (fever, chills, cough, or new shortness of breath).    Past Medical History:  Diagnosis Date  . Cardiomyopathy ([place]) 01/29/2019   EF 35% by echo  . Essential hypertension   . Mixed hyperlipidemia   . Type 2 diabetes mellitus ([place])    Past Surgical History:  Procedure Laterality Date  . CARDIAC CATHETERIZATION  2002   nl cors, nl EF  . COLONOSCOPY N/A 11/26/2013   Procedure: COLONOSCOPY;  Surgeon: Rogene Houston, MD;  [facility];  Service: Endoscopy;  Laterality: N/A;  915  . ESOPHAGOGASTRODUODENOSCOPY N/A 11/26/2013   Procedure: ESOPHAGOGASTRODUODENOSCOPY (EGD);  Surgeon: Rogene Houston, MD;  [facility];  Service: Endoscopy;  Laterality: N/A;  . EYE SURGERY       Current Meds  Medication Sig  . aspirin 81 MG tablet Take 81 mg by mouth daily.  [name] atorvastatin (LIPITOR) 20 MG tablet Take 1 tablet (20 mg total) by mouth daily at 6 PM.  . calcium carbonate (OS-CAL) 600 MG TABS tablet Take 1,200 mg by mouth daily.   . cholecalciferol (VITAMIN D) 400 UNITS TABS tablet Take 1,000 Units by mouth.  . clopidogrel (PLAVIX) 75 MG tablet Take 1 tablet (75 mg total) by mouth daily.  . dorzolamide-timolol (COSOPT) 22.3-6.8 MG/ML ophthalmic solution Place 1 drop into both eyes at bedtime.  . ferrous sulfate (FERROUSUL) 325 (65 FE) MG tablet Take 1 tablet (325 mg total) by mouth daily with breakfast.  .  furosemide (LASIX) 20 MG tablet Take 20 mg by mouth as needed for fluid.   [name] glimepiride (AMARYL) 1 MG tablet Take 1 tablet by mouth 2 (two) times a day.  . latanoprost (XALATAN) 0.005 % ophthalmic solution Place 1 drop into both eyes at bedtime.  [name] LORazepam (ATIVAN) 0.5 MG tablet Take 0.5 mg by mouth daily as needed for anxiety.   [name] omeprazole (PRILOSEC) 20 MG capsule Take 20 mg by mouth daily.  . pantoprazole (PROTONIX) 40 MG tablet Take 1 tablet (40 mg total) by mouth daily.     Allergies:    Patient has no known allergies.   Social History   Tobacco Use  . Smoking status: Never Smoker  . Smokeless tobacco: Never Used  Substance Use Topics  . Alcohol use: No  . Drug use: No     Family Hx: The patient's family history includes Brain cancer in her father; Diabetes Mellitus II in her mother.  ROS:   Please see the history of present illness. All other systems reviewed and are negative.   Prior CV studies:   The following studies were reviewed today:  Echocardiogram 01/29/2019:  1. The left ventricle has a visually estimated ejection fraction of approximately 35%. The cavity size was normal. There is moderately increased left ventricular wall thickness. Left ventricular diastolic Doppler parameters are consistent with impaired  relaxation.  2. There is akinesis of the left ventricular, apical inferior wall.  3. There is akinesis of the left ventricular, mid-apical anteroseptal wall, anterior wall and apical segment.  4. The right ventricle has normal systolic function. The cavity was normal. There is no increase in right ventricular wall thickness. Right ventricular systolic pressure is moderately elevated with an estimated pressure of 47.6 mmHg.  5. The aortic valve is tricuspid. Mild aortic annular calcification noted.  6. The mitral valve is grossly normal. There is mild mitral annular calcification present.  7. The tricuspid valve is grossly normal.  8. The aortic root is normal in size and structure.  Lexiscan Myoview 03/04/2019:  There was no ST segment deviation noted during stress.  The study is normal.  This is a low risk study.  The left ventricular ejection fraction is normal (55-65%).  Labs/Other Tests and Data Reviewed:    EKG:  An ECG dated 02/06/2019 was personally reviewed today and demonstrated:  Sinus rhythm with left anterior fascicular block, poor R wave progression/IVCD.   Recent Labs: 01/29/2019: B Natriuretic Peptide 2,669.0 01/30/2019: ALT 10  01/31/2019: Magnesium 2.3 02/01/2019: Hemoglobin 10.1; Platelets 164 03/11/2019: BUN 18; Creat 1.35; Potassium 4.6; Sodium 142   Recent Lipid Panel Lab Results  Component Value Date/Time   CHOL 141 01/31/2019 05:31 AM   TRIG 124 01/31/2019 05:31 AM   HDL 37 (L) 01/31/2019 05:31 AM   CHOLHDL 3.8 01/31/2019 05:31 AM   LDLCALC 79 01/31/2019 05:31 AM    Wt Readings from Last 3 Encounters:  05/05/19 164 lb (74.4 kg)  02/18/19 184 lb (83.5 kg)  02/06/19 178 lb (80.7 kg)     Objective:    Vital Signs:  Ht 5\' 3"  (1.6 m)   Wt 164 lb (74.4 kg)   BMI 29.05 kg/m    Patient spoke in short sentences on the phone, not short of breath. No audible wheezing or coughing.  ASSESSMENT & PLAN:    1.  History of cardiomyopathy with LVEF approximately 35% by echocardiogram in July.  She had chronic combined heart failure symptoms at that point, subsequently lost  20 pounds and continues on low-dose Lasix.  Follow-up Lexiscan Myoview in August showed no ischemia with normalization of LVEF.  Cardiac medications are limited at this point.  Plan is to follow-up BMET, she might be able to use Lasix on an as-needed basis.  Also follow-up echocardiogram.  2.  Hypertension by history.  She did not tolerate bisoprolol previously due to hypotension.  This can be reevaluated at office follow-up and with review of a repeat echocardiogram.  3.  Mixed hyperlipidemia, she continues on Lipitor with follow-up by PCP.  4.  CKD stage III, creatinine 1.35 in August.  COVID-19 Education: The signs and symptoms of COVID-19 were discussed with the patient and how to seek care for testing (follow up with PCP or arrange E-visit).  The importance of social distancing was discussed today.  Time:   Today, I have spent 6 minutes with the patient with telehealth technology discussing the above problems.     Medication Adjustments/Labs and Tests Ordered: Current medicines are reviewed at length with the patient today.  Concerns  regarding medicines are outlined above.   Tests Ordered: Orders Placed This Encounter  Procedures  . Basic Metabolic Panel (BMET)  . ECHOCARDIOGRAM COMPLETE    Medication Changes: No orders of the defined types were placed in this encounter.   Follow Up:  In Person 6 weeks with [place] in the [place] office.  Signed, Rozann Lesches, MD  05/05/2019 10:52 AM    [place]

## 2019-05-06 ENCOUNTER — Telehealth: Payer: Self-pay | Admitting: Licensed Clinical Social Worker

## 2019-05-06 NOTE — Telephone Encounter (Signed)
CSW referred to assist patient with obtaining a BP cuff. CSW contacted patient to inform cuff will be delivered to home. Patient grateful for support and assistance. CSW available as needed. Jackie Stormy Connon, LCSW, CCSW-MCS [phone]  

## 2019-05-09 ENCOUNTER — Other Ambulatory Visit (HOSPITAL_COMMUNITY): Payer: PPO

## 2019-05-14 DIAGNOSIS — Z23 Encounter for immunization: Secondary | ICD-10-CM | POA: Diagnosis not present

## 2019-05-14 DIAGNOSIS — I10 Essential (primary) hypertension: Secondary | ICD-10-CM | POA: Diagnosis not present

## 2019-05-14 DIAGNOSIS — R4181 Age-related cognitive decline: Secondary | ICD-10-CM | POA: Diagnosis not present

## 2019-05-14 DIAGNOSIS — E1165 Type 2 diabetes mellitus with hyperglycemia: Secondary | ICD-10-CM | POA: Diagnosis not present

## 2019-05-14 DIAGNOSIS — R609 Edema, unspecified: Secondary | ICD-10-CM | POA: Diagnosis not present

## 2019-05-20 ENCOUNTER — Other Ambulatory Visit: Payer: Self-pay

## 2019-05-20 ENCOUNTER — Ambulatory Visit (HOSPITAL_COMMUNITY)
Admission: RE | Admit: 2019-05-20 | Discharge: 2019-05-20 | Disposition: A | Discharge status: Home / Self Care | Payer: PPO | Source: Ambulatory Visit | Attending: Cardiology | Admitting: Cardiology

## 2019-05-20 DIAGNOSIS — I119 Hypertensive heart disease without heart failure: Secondary | ICD-10-CM | POA: Diagnosis not present

## 2019-05-20 DIAGNOSIS — E119 Type 2 diabetes mellitus without complications: Secondary | ICD-10-CM | POA: Insufficient documentation

## 2019-05-20 DIAGNOSIS — I429 Cardiomyopathy, unspecified: Secondary | ICD-10-CM

## 2019-05-20 DIAGNOSIS — E785 Hyperlipidemia, unspecified: Secondary | ICD-10-CM | POA: Diagnosis not present

## 2019-05-20 NOTE — Progress Notes (Signed)
*  PRELIMINARY RESULTS* Echocardiogram 2D Echocardiogram has been performed.  Caitlin Reed 05/20/2019, 12:32 PM

## 2019-05-27 DIAGNOSIS — E1129 Type 2 diabetes mellitus with other diabetic kidney complication: Secondary | ICD-10-CM | POA: Diagnosis not present

## 2019-05-27 DIAGNOSIS — I10 Essential (primary) hypertension: Secondary | ICD-10-CM | POA: Diagnosis not present

## 2019-05-27 DIAGNOSIS — E1122 Type 2 diabetes mellitus with diabetic chronic kidney disease: Secondary | ICD-10-CM | POA: Diagnosis not present

## 2019-05-27 DIAGNOSIS — E782 Mixed hyperlipidemia: Secondary | ICD-10-CM | POA: Diagnosis not present

## 2019-06-17 ENCOUNTER — Encounter: Payer: Self-pay | Admitting: Student

## 2019-06-17 ENCOUNTER — Ambulatory Visit: Payer: PPO | Admitting: Student

## 2019-06-17 NOTE — Progress Notes (Deleted)
Cardiology Office Note    Date:  06/17/2019   ID:  Caitlin Reed, DOB 1935/08/03, MRN [id number]  PCP:  Lemmie Evens, MD  Cardiologist: Rozann Lesches, MD    No chief complaint on file.   History of Present Illness:    Caitlin Reed is a 83 y.o. female with past medical history of secondary cardiomyopathy (EF 35% by echo in 01/2019 at the time of admission for gastroenteritis/duodenitis, NST in 03/2019 showing no ischemia and a low-risk study, EF normalized to 55-60% by repeat echo in 05/2019), HTN, Type 2 DM, and HLD who presents to the office today for 6 week follow-up.   She most recently had a telehealth visit with Dr. Domenic Polite in 05/2019 and recent stress test had shown no ischemia and normalization of her EF. Was still taking ASA, Plavix, Lipitor and Lasix 10mg  daily at the time of her visit. Bisoprolol was previously discontinued given bradycardia and hypotension. A follow-up echo as arranged and she was continued on her current medication regimen. Echo did show her EF had normalized to 55 to 60%. Was noted to have moderate LVH but no significant valve abnormalities.    - BMET  Past Medical History:  Diagnosis Date  . Cardiomyopathy ([place]) 01/29/2019   EF 35% by echo  . Essential hypertension   . Mixed hyperlipidemia   . Type 2 diabetes mellitus ([place])     Past Surgical History:  Procedure Laterality Date  . CARDIAC CATHETERIZATION  2002   nl cors, nl EF  . COLONOSCOPY N/A 11/26/2013   Procedure: COLONOSCOPY;  Surgeon: Rogene Houston, MD;  [facility];  Service: Endoscopy;  Laterality: N/A;  915  . ESOPHAGOGASTRODUODENOSCOPY N/A 11/26/2013   Procedure: ESOPHAGOGASTRODUODENOSCOPY (EGD);  Surgeon: Rogene Houston, MD;  [facility];  Service: Endoscopy;  Laterality: N/A;  . EYE SURGERY      Current Medications: Outpatient Medications Prior to Visit  Medication Sig Dispense Refill  . aspirin 81 MG tablet Take 81 mg by mouth daily.     [name] atorvastatin (LIPITOR) 20 MG tablet Take 1 tablet (20 mg total) by mouth daily at 6 PM. 30 tablet 1  . calcium carbonate (OS-CAL) 600 MG TABS tablet Take 1,200 mg by mouth daily.     . cholecalciferol (VITAMIN D) 400 UNITS TABS tablet Take 1,000 Units by mouth.    . clopidogrel (PLAVIX) 75 MG tablet Take 1 tablet (75 mg total) by mouth daily. 30 tablet 1  . dorzolamide-timolol (COSOPT) 22.3-6.8 MG/ML ophthalmic solution Place 1 drop into both eyes at bedtime.    . ferrous sulfate (FERROUSUL) 325 (65 FE) MG tablet Take 1 tablet (325 mg total) by mouth daily with breakfast.  0  . furosemide (LASIX) 20 MG tablet Take 20 mg by mouth as needed for fluid.     [name] glimepiride (AMARYL) 1 MG tablet Take 1 tablet by mouth 2 (two) times a day.    . latanoprost (XALATAN) 0.005 % ophthalmic solution Place 1 drop into both eyes at bedtime.    [name] LORazepam (ATIVAN) 0.5 MG tablet Take 0.5 mg by mouth daily as needed for anxiety.     [name] omeprazole (PRILOSEC) 20 MG capsule Take 20 mg by mouth daily.    . pantoprazole (PROTONIX) 40 MG tablet Take 1 tablet (40 mg total) by mouth daily. 30 tablet 1   No facility-administered medications prior to visit.      Allergies:   Patient has no known allergies.  Social History   Socioeconomic History  . Marital status: Married    Spouse name: Not on file  . Number of children: Not on file  . Years of education: Not on file  . Highest education level: Not on file  Occupational History  . Not on file  Social Needs  . Financial resource strain: Not on file  . Food insecurity    Worry: Not on file    Inability: Not on file  . Transportation needs    Medical: Not on file    Non-medical: Not on file  Tobacco Use  . Smoking status: Never Smoker  . Smokeless tobacco: Never Used  Substance and Sexual Activity  . Alcohol use: No  . Drug use: No  . Sexual activity: Not on file  Lifestyle  . Physical activity    Days per week: Not on file    Minutes per  session: Not on file  . Stress: Not on file  Relationships  . Social Herbalist on phone: Not on file    Gets together: Not on file    Attends religious service: Not on file    Active member of club or organization: Not on file    Attends meetings of clubs or organizations: Not on file    Relationship status: Not on file  Other Topics Concern  . Not on file  Social History Narrative  . Not on file     Family History:  The patient's ***family history includes Brain cancer in her father; Diabetes Mellitus II in her mother.   Review of Systems:   Please see the history of present illness.     General:  No chills, fever, night sweats or weight changes.  Cardiovascular:  No chest pain, dyspnea on exertion, edema, orthopnea, palpitations, paroxysmal nocturnal dyspnea. Dermatological: No rash, lesions/masses Respiratory: No cough, dyspnea Urologic: No hematuria, dysuria Abdominal:   No nausea, vomiting, diarrhea, bright red blood per rectum, melena, or hematemesis Neurologic:  No visual changes, wkns, changes in mental status. All other systems reviewed and are otherwise negative except as noted above.   Physical Exam:    VS:  There were no vitals taken for this visit.   General: Well developed, well nourished,female appearing in no acute distress. Head: Normocephalic, atraumatic, sclera non-icteric, no xanthomas, nares are without discharge.  Neck: No carotid bruits. JVD not elevated.  Lungs: Respirations regular and unlabored, without wheezes or rales.  Heart: ***Regular rate and rhythm. No S3 or S4.  No murmur, no rubs, or gallops appreciated. Abdomen: Soft, non-tender, non-distended with normoactive bowel sounds. No hepatomegaly. No rebound/guarding. No obvious abdominal masses. Msk:  Strength and tone appear normal for age. No joint deformities or effusions. Extremities: No clubbing or cyanosis. No edema.  Distal pedal pulses are 2+ bilaterally. Neuro: Alert and  oriented X 3. Moves all extremities spontaneously. No focal deficits noted. Psych:  Responds to questions appropriately with a normal affect. Skin: No rashes or lesions noted  Wt Readings from Last 3 Encounters:  05/05/19 164 lb (74.4 kg)  02/18/19 184 lb (83.5 kg)  02/06/19 178 lb (80.7 kg)        Studies/Labs Reviewed:   EKG:  EKG is*** ordered today.  The ekg ordered today demonstrates ***  Recent Labs: 01/29/2019: B Natriuretic Peptide 2,669.0 01/30/2019: ALT 10 01/31/2019: Magnesium 2.3 02/01/2019: Hemoglobin 10.1; Platelets 164 03/11/2019: BUN 18; Creat 1.35; Potassium 4.6; Sodium 142   Lipid Panel    Component Value  Date/Time   CHOL 141 01/31/2019 0531   TRIG 124 01/31/2019 0531   HDL 37 (L) 01/31/2019 0531   CHOLHDL 3.8 01/31/2019 0531   VLDL 25 01/31/2019 0531   LDLCALC 79 01/31/2019 0531    Additional studies/ records that were reviewed today include:   Echocardiogram: 01/29/2019 IMPRESSIONS    1. The left ventricle has a visually estimated ejection fraction of approximately 35%. The cavity size was normal. There is moderately increased left ventricular wall thickness. Left ventricular diastolic Doppler parameters are consistent with impaired  relaxation.  2. There is akinesis of the left ventricular, apical inferior wall.  3. There is akinesis of the left ventricular, mid-apical anteroseptal wall, anterior wall and apical segment.  4. The right ventricle has normal systolic function. The cavity was normal. There is no increase in right ventricular wall thickness. Right ventricular systolic pressure is moderately elevated with an estimated pressure of 47.6 mmHg.  5. The aortic valve is tricuspid. Mild aortic annular calcification noted.  6. The mitral valve is grossly normal. There is mild mitral annular calcification present.  7. The tricuspid valve is grossly normal.  8. The aortic root is normal in size and structure.  NST: 03/2019  There was no ST segment  deviation noted during stress.  The study is normal.  This is a low risk study.  The left ventricular ejection fraction is normal (55-65%).  Echocardiogram: 05/20/2019 IMPRESSIONS    1. Left ventricular ejection fraction, by visual estimation, is 55 to 60%. The left ventricle has normal function. There is moderately increased left ventricular hypertrophy.  2. Elevated mean left atrial pressure.  3. Left ventricular diastolic Doppler parameters are consistent with impaired relaxation pattern of LV diastolic filling.  4. Global right ventricle has normal systolic function.The right ventricular size is normal. No increase in right ventricular wall thickness.  5. Left atrial size was normal.  6. Right atrial size was normal.  7. Moderate mitral annular calcification.  8. The mitral valve is abnormal. No evidence of mitral valve regurgitation. No evidence of mitral stenosis.  9. The tricuspid valve is normal in structure. Tricuspid valve regurgitation is trivial. 10. The aortic valve is tricuspid Aortic valve regurgitation was not visualized by color flow Doppler. Structurally normal aortic valve, with no evidence of sclerosis or stenosis. 11. The pulmonic valve was not well visualized. Pulmonic valve regurgitation is not visualized by color flow Doppler. 12. Mildly elevated pulmonary artery systolic pressure. 13. The inferior vena cava is normal in size with greater than 50% respiratory variability, suggesting right atrial pressure of 3 mmHg.  Assessment:    No diagnosis found.   Plan:   In order of problems listed above:  1. ***    Medication Adjustments/Labs and Tests Ordered: Current medicines are reviewed at length with the patient today.  Concerns regarding medicines are outlined above.  Medication changes, Labs and Tests ordered today are listed in the Patient Instructions below. There are no Patient Instructions on file for this visit.   Signed, Erma Heritage,  PA-C  06/17/2019 9:44 AM    [facility] [address] [place], [place] [address] Phone: [phone] Fax: [phone]

## 2019-06-24 DIAGNOSIS — E1142 Type 2 diabetes mellitus with diabetic polyneuropathy: Secondary | ICD-10-CM | POA: Diagnosis not present

## 2019-06-24 DIAGNOSIS — B351 Tinea unguium: Secondary | ICD-10-CM | POA: Diagnosis not present

## 2019-06-24 DIAGNOSIS — L03116 Cellulitis of left lower limb: Secondary | ICD-10-CM | POA: Diagnosis not present

## 2019-06-24 DIAGNOSIS — E1165 Type 2 diabetes mellitus with hyperglycemia: Secondary | ICD-10-CM | POA: Diagnosis not present

## 2019-06-24 DIAGNOSIS — F419 Anxiety disorder, unspecified: Secondary | ICD-10-CM | POA: Diagnosis not present

## 2019-06-24 DIAGNOSIS — L03115 Cellulitis of right lower limb: Secondary | ICD-10-CM | POA: Diagnosis not present

## 2019-06-24 DIAGNOSIS — L97521 Non-pressure chronic ulcer of other part of left foot limited to breakdown of skin: Secondary | ICD-10-CM | POA: Diagnosis not present

## 2019-06-24 DIAGNOSIS — L851 Acquired keratosis [keratoderma] palmaris et plantaris: Secondary | ICD-10-CM | POA: Diagnosis not present

## 2019-06-24 DIAGNOSIS — S81802A Unspecified open wound, left lower leg, initial encounter: Secondary | ICD-10-CM | POA: Diagnosis not present

## 2019-06-25 DIAGNOSIS — E1129 Type 2 diabetes mellitus with other diabetic kidney complication: Secondary | ICD-10-CM | POA: Diagnosis not present

## 2019-06-25 DIAGNOSIS — N390 Urinary tract infection, site not specified: Secondary | ICD-10-CM | POA: Diagnosis not present

## 2019-07-08 DIAGNOSIS — R600 Localized edema: Secondary | ICD-10-CM | POA: Diagnosis not present

## 2019-07-08 DIAGNOSIS — L97521 Non-pressure chronic ulcer of other part of left foot limited to breakdown of skin: Secondary | ICD-10-CM | POA: Diagnosis not present

## 2019-07-08 DIAGNOSIS — E1142 Type 2 diabetes mellitus with diabetic polyneuropathy: Secondary | ICD-10-CM | POA: Diagnosis not present

## 2019-07-09 DIAGNOSIS — E1165 Type 2 diabetes mellitus with hyperglycemia: Secondary | ICD-10-CM | POA: Diagnosis not present

## 2019-07-09 DIAGNOSIS — L0390 Cellulitis, unspecified: Secondary | ICD-10-CM | POA: Diagnosis not present

## 2019-07-09 DIAGNOSIS — R600 Localized edema: Secondary | ICD-10-CM | POA: Diagnosis not present

## 2019-07-09 DIAGNOSIS — N183 Chronic kidney disease, stage 3 unspecified: Secondary | ICD-10-CM | POA: Diagnosis not present

## 2019-07-15 DIAGNOSIS — L97521 Non-pressure chronic ulcer of other part of left foot limited to breakdown of skin: Secondary | ICD-10-CM | POA: Diagnosis not present

## 2019-07-15 DIAGNOSIS — E1142 Type 2 diabetes mellitus with diabetic polyneuropathy: Secondary | ICD-10-CM | POA: Diagnosis not present

## 2019-07-22 DIAGNOSIS — E1165 Type 2 diabetes mellitus with hyperglycemia: Secondary | ICD-10-CM | POA: Diagnosis not present

## 2019-07-22 DIAGNOSIS — R600 Localized edema: Secondary | ICD-10-CM | POA: Diagnosis not present

## 2019-07-22 DIAGNOSIS — I509 Heart failure, unspecified: Secondary | ICD-10-CM | POA: Diagnosis not present

## 2019-07-22 DIAGNOSIS — N183 Chronic kidney disease, stage 3 unspecified: Secondary | ICD-10-CM | POA: Diagnosis not present

## 2019-07-31 DIAGNOSIS — E1122 Type 2 diabetes mellitus with diabetic chronic kidney disease: Secondary | ICD-10-CM | POA: Diagnosis not present

## 2019-07-31 DIAGNOSIS — D649 Anemia, unspecified: Secondary | ICD-10-CM | POA: Diagnosis not present

## 2019-07-31 DIAGNOSIS — I10 Essential (primary) hypertension: Secondary | ICD-10-CM | POA: Diagnosis not present

## 2019-07-31 DIAGNOSIS — E1129 Type 2 diabetes mellitus with other diabetic kidney complication: Secondary | ICD-10-CM | POA: Diagnosis not present

## 2019-07-31 DIAGNOSIS — N390 Urinary tract infection, site not specified: Secondary | ICD-10-CM | POA: Diagnosis not present

## 2019-07-31 DIAGNOSIS — R419 Unspecified symptoms and signs involving cognitive functions and awareness: Secondary | ICD-10-CM | POA: Diagnosis not present

## 2019-07-31 DIAGNOSIS — D509 Iron deficiency anemia, unspecified: Secondary | ICD-10-CM | POA: Diagnosis not present

## 2019-07-31 DIAGNOSIS — E782 Mixed hyperlipidemia: Secondary | ICD-10-CM | POA: Diagnosis not present

## 2019-08-01 DIAGNOSIS — E860 Dehydration: Secondary | ICD-10-CM | POA: Diagnosis not present

## 2019-08-01 DIAGNOSIS — E871 Hypo-osmolality and hyponatremia: Secondary | ICD-10-CM | POA: Diagnosis not present

## 2019-08-01 DIAGNOSIS — N183 Chronic kidney disease, stage 3 unspecified: Secondary | ICD-10-CM | POA: Diagnosis not present

## 2019-08-01 DIAGNOSIS — E876 Hypokalemia: Secondary | ICD-10-CM | POA: Diagnosis not present

## 2019-08-04 DIAGNOSIS — N183 Chronic kidney disease, stage 3 unspecified: Secondary | ICD-10-CM | POA: Diagnosis not present

## 2019-08-04 DIAGNOSIS — F0390 Unspecified dementia without behavioral disturbance: Secondary | ICD-10-CM | POA: Diagnosis not present

## 2019-08-04 DIAGNOSIS — F419 Anxiety disorder, unspecified: Secondary | ICD-10-CM | POA: Diagnosis not present

## 2019-08-04 DIAGNOSIS — G4700 Insomnia, unspecified: Secondary | ICD-10-CM | POA: Diagnosis not present

## 2019-08-06 DIAGNOSIS — N183 Chronic kidney disease, stage 3 unspecified: Secondary | ICD-10-CM | POA: Diagnosis not present

## 2019-08-06 DIAGNOSIS — E876 Hypokalemia: Secondary | ICD-10-CM | POA: Diagnosis not present

## 2019-08-06 DIAGNOSIS — E871 Hypo-osmolality and hyponatremia: Secondary | ICD-10-CM | POA: Diagnosis not present

## 2019-08-06 DIAGNOSIS — R410 Disorientation, unspecified: Secondary | ICD-10-CM | POA: Diagnosis not present

## 2019-08-07 DIAGNOSIS — R419 Unspecified symptoms and signs involving cognitive functions and awareness: Secondary | ICD-10-CM | POA: Diagnosis not present

## 2019-08-07 DIAGNOSIS — D509 Iron deficiency anemia, unspecified: Secondary | ICD-10-CM | POA: Diagnosis not present

## 2019-08-07 DIAGNOSIS — E1122 Type 2 diabetes mellitus with diabetic chronic kidney disease: Secondary | ICD-10-CM | POA: Diagnosis not present

## 2019-08-07 DIAGNOSIS — I10 Essential (primary) hypertension: Secondary | ICD-10-CM | POA: Diagnosis not present

## 2019-08-07 DIAGNOSIS — E1129 Type 2 diabetes mellitus with other diabetic kidney complication: Secondary | ICD-10-CM | POA: Diagnosis not present

## 2019-08-07 DIAGNOSIS — E782 Mixed hyperlipidemia: Secondary | ICD-10-CM | POA: Diagnosis not present

## 2019-08-07 DIAGNOSIS — D649 Anemia, unspecified: Secondary | ICD-10-CM | POA: Diagnosis not present

## 2019-08-15 DIAGNOSIS — R600 Localized edema: Secondary | ICD-10-CM | POA: Diagnosis not present

## 2019-08-15 DIAGNOSIS — L03115 Cellulitis of right lower limb: Secondary | ICD-10-CM | POA: Diagnosis not present

## 2019-08-15 DIAGNOSIS — L03116 Cellulitis of left lower limb: Secondary | ICD-10-CM | POA: Diagnosis not present

## 2019-08-15 DIAGNOSIS — E1165 Type 2 diabetes mellitus with hyperglycemia: Secondary | ICD-10-CM | POA: Diagnosis not present

## 2019-08-26 DIAGNOSIS — D509 Iron deficiency anemia, unspecified: Secondary | ICD-10-CM | POA: Diagnosis not present

## 2019-08-26 DIAGNOSIS — L03116 Cellulitis of left lower limb: Secondary | ICD-10-CM | POA: Diagnosis not present

## 2019-08-26 DIAGNOSIS — L03115 Cellulitis of right lower limb: Secondary | ICD-10-CM | POA: Diagnosis not present

## 2019-08-26 DIAGNOSIS — E782 Mixed hyperlipidemia: Secondary | ICD-10-CM | POA: Diagnosis not present

## 2019-08-26 DIAGNOSIS — D649 Anemia, unspecified: Secondary | ICD-10-CM | POA: Diagnosis not present

## 2019-08-26 DIAGNOSIS — E1122 Type 2 diabetes mellitus with diabetic chronic kidney disease: Secondary | ICD-10-CM | POA: Diagnosis not present

## 2019-08-26 DIAGNOSIS — R419 Unspecified symptoms and signs involving cognitive functions and awareness: Secondary | ICD-10-CM | POA: Diagnosis not present

## 2019-08-26 DIAGNOSIS — R600 Localized edema: Secondary | ICD-10-CM | POA: Diagnosis not present

## 2019-08-26 DIAGNOSIS — E1129 Type 2 diabetes mellitus with other diabetic kidney complication: Secondary | ICD-10-CM | POA: Diagnosis not present

## 2019-08-26 DIAGNOSIS — N390 Urinary tract infection, site not specified: Secondary | ICD-10-CM | POA: Diagnosis not present

## 2019-08-26 DIAGNOSIS — I10 Essential (primary) hypertension: Secondary | ICD-10-CM | POA: Diagnosis not present

## 2019-09-02 DIAGNOSIS — L851 Acquired keratosis [keratoderma] palmaris et plantaris: Secondary | ICD-10-CM | POA: Diagnosis not present

## 2019-09-02 DIAGNOSIS — B351 Tinea unguium: Secondary | ICD-10-CM | POA: Diagnosis not present

## 2019-09-02 DIAGNOSIS — E1142 Type 2 diabetes mellitus with diabetic polyneuropathy: Secondary | ICD-10-CM | POA: Diagnosis not present

## 2019-09-12 DIAGNOSIS — R600 Localized edema: Secondary | ICD-10-CM | POA: Diagnosis not present

## 2019-09-12 DIAGNOSIS — L03115 Cellulitis of right lower limb: Secondary | ICD-10-CM | POA: Diagnosis not present

## 2019-09-12 DIAGNOSIS — N183 Chronic kidney disease, stage 3 unspecified: Secondary | ICD-10-CM | POA: Diagnosis not present

## 2019-09-12 DIAGNOSIS — E1122 Type 2 diabetes mellitus with diabetic chronic kidney disease: Secondary | ICD-10-CM | POA: Diagnosis not present

## 2019-10-08 DIAGNOSIS — R600 Localized edema: Secondary | ICD-10-CM | POA: Diagnosis not present

## 2019-10-08 DIAGNOSIS — Z7189 Other specified counseling: Secondary | ICD-10-CM | POA: Diagnosis not present

## 2019-11-11 DIAGNOSIS — E1142 Type 2 diabetes mellitus with diabetic polyneuropathy: Secondary | ICD-10-CM | POA: Diagnosis not present

## 2019-11-11 DIAGNOSIS — L851 Acquired keratosis [keratoderma] palmaris et plantaris: Secondary | ICD-10-CM | POA: Diagnosis not present

## 2019-11-11 DIAGNOSIS — B351 Tinea unguium: Secondary | ICD-10-CM | POA: Diagnosis not present

## 2019-11-18 DIAGNOSIS — E162 Hypoglycemia, unspecified: Secondary | ICD-10-CM | POA: Diagnosis not present

## 2019-11-18 DIAGNOSIS — E1122 Type 2 diabetes mellitus with diabetic chronic kidney disease: Secondary | ICD-10-CM | POA: Diagnosis not present

## 2019-12-05 DIAGNOSIS — E162 Hypoglycemia, unspecified: Secondary | ICD-10-CM | POA: Diagnosis not present

## 2019-12-05 DIAGNOSIS — R600 Localized edema: Secondary | ICD-10-CM | POA: Diagnosis not present

## 2019-12-05 DIAGNOSIS — E1142 Type 2 diabetes mellitus with diabetic polyneuropathy: Secondary | ICD-10-CM | POA: Diagnosis not present

## 2019-12-05 DIAGNOSIS — R531 Weakness: Secondary | ICD-10-CM | POA: Diagnosis not present

## 2019-12-16 DIAGNOSIS — I10 Essential (primary) hypertension: Secondary | ICD-10-CM | POA: Diagnosis not present

## 2019-12-16 DIAGNOSIS — E1129 Type 2 diabetes mellitus with other diabetic kidney complication: Secondary | ICD-10-CM | POA: Diagnosis not present

## 2019-12-16 DIAGNOSIS — N390 Urinary tract infection, site not specified: Secondary | ICD-10-CM | POA: Diagnosis not present

## 2019-12-16 DIAGNOSIS — D649 Anemia, unspecified: Secondary | ICD-10-CM | POA: Diagnosis not present

## 2019-12-16 DIAGNOSIS — D509 Iron deficiency anemia, unspecified: Secondary | ICD-10-CM | POA: Diagnosis not present

## 2019-12-16 DIAGNOSIS — E782 Mixed hyperlipidemia: Secondary | ICD-10-CM | POA: Diagnosis not present

## 2019-12-16 DIAGNOSIS — R419 Unspecified symptoms and signs involving cognitive functions and awareness: Secondary | ICD-10-CM | POA: Diagnosis not present

## 2019-12-16 DIAGNOSIS — E1122 Type 2 diabetes mellitus with diabetic chronic kidney disease: Secondary | ICD-10-CM | POA: Diagnosis not present

## 2020-01-05 DIAGNOSIS — E1142 Type 2 diabetes mellitus with diabetic polyneuropathy: Secondary | ICD-10-CM | POA: Diagnosis not present

## 2020-01-05 DIAGNOSIS — R600 Localized edema: Secondary | ICD-10-CM | POA: Diagnosis not present

## 2020-01-05 DIAGNOSIS — F0390 Unspecified dementia without behavioral disturbance: Secondary | ICD-10-CM | POA: Diagnosis not present

## 2020-01-05 DIAGNOSIS — N183 Chronic kidney disease, stage 3 unspecified: Secondary | ICD-10-CM | POA: Diagnosis not present

## 2020-01-07 ENCOUNTER — Ambulatory Visit (HOSPITAL_COMMUNITY): Payer: PPO | Attending: Nurse Practitioner | Admitting: Physical Therapy

## 2020-01-07 ENCOUNTER — Other Ambulatory Visit: Payer: Self-pay

## 2020-01-07 ENCOUNTER — Encounter (HOSPITAL_COMMUNITY): Payer: Self-pay | Admitting: Physical Therapy

## 2020-01-07 DIAGNOSIS — R2689 Other abnormalities of gait and mobility: Secondary | ICD-10-CM | POA: Diagnosis not present

## 2020-01-07 DIAGNOSIS — M25551 Pain in right hip: Secondary | ICD-10-CM | POA: Insufficient documentation

## 2020-01-07 DIAGNOSIS — M6281 Muscle weakness (generalized): Secondary | ICD-10-CM | POA: Diagnosis not present

## 2020-01-07 DIAGNOSIS — R29898 Other symptoms and signs involving the musculoskeletal system: Secondary | ICD-10-CM | POA: Insufficient documentation

## 2020-01-07 NOTE — Therapy (Signed)
[place], [place], [address] Phone: [phone]   Fax:  [phone]  Physical Therapy Evaluation  Patient Details  Name: Caitlin Reed MRN: [id number] Date of Birth: 12/06/1935 Referring Provider (PT): Carlis Abbott NP   Encounter Date: 01/07/2020  PT End of Session - 01/07/20 1437    Visit Number  1    Number of Visits  12    Date for PT Re-Evaluation  02/18/20    Authorization Type  [employer] (no auth, no visit limit)    Progress Note Due on Visit  10    PT Start Time  0145    PT Stop Time  1430    PT Time Calculation (min)  765 min    Equipment Utilized During Treatment  Gait belt    Activity Tolerance  Patient tolerated treatment well;Patient limited by fatigue    Behavior During Therapy  [facility] for tasks assessed/performed       Past Medical History:  Diagnosis Date  . Cardiomyopathy ([place]) 01/29/2019   a. EF 35% by echo in 01/2019 at the time of admission for gastroenteritis/duodenitis b. NST in 03/2019 showing no ischemia --> low-risk study b. EF normalized to 55-60% by repeat echo in 05/2019  . Essential hypertension   . Mixed hyperlipidemia   . Type 2 diabetes mellitus ([place])     Past Surgical History:  Procedure Laterality Date  . CARDIAC CATHETERIZATION  2002   nl cors, nl EF  . COLONOSCOPY N/A 11/26/2013   Procedure: COLONOSCOPY;  Surgeon: Rogene Houston, MD;  [facility];  Service: Endoscopy;  Laterality: N/A;  915  . ESOPHAGOGASTRODUODENOSCOPY N/A 11/26/2013   Procedure: ESOPHAGOGASTRODUODENOSCOPY (EGD);  Surgeon: Rogene Houston, MD;  [facility];  Service: Endoscopy;  Laterality: N/A;  . EYE SURGERY      There were no vitals filed for this visit.   Subjective Assessment - 01/07/20 1349    Subjective  Patient is a 84 y.o. female who presents to physical therapy with c/o weakness. She just feels weaker over the last year and she has trouble walking,  getting out of chairs, lifting her leg. She has trouble straightening up and standing up tall. She also hip pain. She is assisted by daughter with ADL. She uses rollator or wheelchair for longer stairs.    How long can you walk comfortably?  50 feet    Patient Stated Goals  walking better    Currently in Pain?  Yes    Pain Score  3     Pain Location  Hip    Pain Orientation  Right         [facility] PT Assessment - 01/07/20 0001      Assessment   Medical Diagnosis  Weakness    Referring Provider (PT)  Carlis Abbott NP    Onset Date/Surgical Date  01/07/19    Prior Therapy  none      Precautions   Precautions  Fall      Restrictions   Weight Bearing Restrictions  No      Balance Screen   Has the patient fallen in the past 6 months  Yes    How many times?  1    Has the patient had a decrease in activity level because of a fear of falling?   Yes    Is the patient reluctant to leave their home because of a fear of falling?   Yes  Prior Function   Level of Independence  Needs assistance with ADLs;Needs assistance with homemaking;Independent with household mobility with device    Vocation  Retired      Charity fundraiser Status  Within Functional Limits for tasks assessed      Observation/Other Assessments   Observations  Ambulates with rollator, flexed posture, slouched in seated; LE edema and redness, weeping LE wounds    Focus on Therapeutic Outcomes (FOTO)   not completed      ROM / Strength   AROM / PROM / Strength  AROM;Strength      AROM   Overall AROM   Within functional limits for tasks performed      Strength   Strength Assessment Site  Hip;Knee;Ankle    Right/Left Hip  Right;Left    Right Hip Flexion  3+/5    Left Hip Flexion  3+/5    Right/Left Knee  Right;Left    Right Knee Flexion  4-/5    Right Knee Extension  4-/5    Left Knee Flexion  4/5    Left Knee Extension  4/5    Right/Left Ankle  Right;Left    Right Ankle Dorsiflexion  5/5     Left Ankle Dorsiflexion  5/5      Transfers   Comments  slow, labored requires use of hands, unsteady upon standing, flexed posture      Ambulation/Gait   Ambulation/Gait  Yes    Ambulation/Gait Assistance  4: Min guard    Ambulation Distance (Feet)  100 Feet    Assistive device  4-wheeled walker    Gait Pattern  Decreased step length - right;Decreased step length - left;Decreased stance time - right;Trunk flexed;Wide base of support;Poor foot clearance - left;Poor foot clearance - right    Ambulation Surface  Level;Indoor    Gait Comments  2 MWT, slow, labored                  Objective measurements completed on examination: See above findings.      [place] Adult PT Treatment/Exercise - 01/07/20 0001      Exercises   Exercises  Knee/Hip      Knee/Hip Exercises: Seated   Long Arc Quad  Both;1 set;10 reps    Long Arc Quad Limitations  10 seconds    Other Seated Knee/Hip Exercises  heel/toe raises 2x10    Marching  Both;1 set;10 reps             PT Education - 01/07/20 1358    Education Details  Patient educated on exam findings, POC, scope of PT, gait mechanics, initial HEP, posture, mechanics of exercise    Person(s) Educated  Patient;Child(ren)    Methods  Explanation;Demonstration;Handout    Comprehension  Verbalized understanding;Returned demonstration       PT Short Term Goals - 01/07/20 1453      PT SHORT TERM GOAL #1   Title  Patient will be independent with HEP in order to improve functional outcomes.    Time  3    Period  Weeks    Status  New    Target Date  01/28/20      PT SHORT TERM GOAL #2   Title  Patient will ambulate with rollator with proper mechanics in order to improve safety with ambulation.    Time  3    Period  Weeks    Status  New    Target Date  01/28/20  PT Long Term Goals - 01/07/20 1454      PT LONG TERM GOAL #1   Title  Patient will report at least 75% improvement in symptoms for improved quality of life.     Time  6    Period  Weeks    Status  New    Target Date  02/18/20      PT LONG TERM GOAL #2   Title  Patient will be able to ambulate at least 160 feet in 2MWT in order to demonstrate improved tolerance to activity.    Time  6    Period  Weeks    Status  New    Target Date  02/18/20      PT LONG TERM GOAL #3   Title  Patient will be able to perform sit to stand without UE use in order to demonstrate improved functional strength.    Time  6    Period  Weeks    Status  New    Target Date  02/18/20             Plan - 01/07/20 1444    Clinical Impression Statement  Patient is a 84 y.o. female who presents to physical therapy with c/o weakness which has progressed over the last year. She presents with deficits in LE strength, gait, activity tolerance, endurance, postural impairments, transfers, balance, and functional mobility with ADL. She is having to modify and restrict ADL as indicated by subjective information and objective measures which is affecting overall participation. Patient ambulates with flexed posture with improper mechanics on rollator and appears to be at an increased risk for falling. Patient demonstrates impaired LE strength during transfers to standing and is unsteady upon standing. Patient tolerates initial exercises well and is educated on completing them at home. Patient's daughter is present for session. Patient will benefit from skilled physical therapy in order to improve function and reduce impairment.    Personal Factors and Comorbidities  Age;Comorbidity 3+;Past/Current Experience;Fitness;Time since onset of injury/illness/exacerbation    Comorbidities  DM, CHF, sedentary lifestyle, obesity    Examination-Activity Limitations  Bed Mobility;Transfers;Locomotion Level;Squat;Stairs;Stand;Lift;Carry;Bend    Examination-Participation Restrictions  Lawyer;Shop;Interpersonal Relationship;Community Activity;Cleaning    Stability/Clinical Decision Making   Stable/Uncomplicated    Clinical Decision Making  Low    Rehab Potential  Fair    PT Frequency  2x / week    PT Duration  6 weeks    PT Treatment/Interventions  ADLs/Self Care Home Management;Aquatic Therapy;Biofeedback;Cryotherapy;Electrical Stimulation;Moist Heat;Iontophoresis 4mg /ml Dexamethasone;Traction;Ultrasound;DME Instruction;Gait training;Stair training;Functional mobility training;Therapeutic activities;Therapeutic exercise;Balance training;Neuromuscular re-education;Patient/family education;Manual techniques;Manual lymph drainage;Compression bandaging;Orthotic Fit/Training;Dry needling;Joint Manipulations;Energy conservation;Splinting;Taping;Vasopneumatic Device    PT Next Visit Plan  begin balance and gait training, continue LE strengthing and mobility exercises and progress as able    PT Home Exercise Plan  6/9 Seated marching, LAQ, ankle pumps, heel/toe raises    Consulted and Agree with Plan of Care  Patient       Patient will benefit from skilled therapeutic intervention in order to improve the following deficits and impairments:  Abnormal gait, Decreased range of motion, Difficulty walking, Decreased endurance, Pain, Decreased activity tolerance, Decreased balance, Decreased knowledge of use of DME, Decreased mobility, Decreased strength, Increased edema, Postural dysfunction, Improper body mechanics  Visit Diagnosis: Muscle weakness (generalized)  Other abnormalities of gait and mobility  Other symptoms and signs involving the musculoskeletal system  Pain in right hip     Problem List Patient Active Problem List   Diagnosis Date Noted  .  Elevated troponin 01/30/2019  . AKI (acute kidney injury) ([place])   . Duodenitis   . Ischemic cardiomyopathy   . Elevated troponin I level 01/29/2019  . Glaucoma 01/29/2019  . Mixed hyperlipidemia   . Essential hypertension   . Elevated serum creatinine   . Hypokalemia   . Acute gastroenteritis   . Unilateral primary  osteoarthritis, right knee 03/27/2018  . Anemia, iron deficiency 11/13/2013  . High cholesterol 11/13/2013  . Type 2 diabetes mellitus ([place]) 11/13/2013    2:58 PM, 01/07/20 Mearl Latin PT, DPT Physical Therapist at [facility] [place], [place], [address] Phone: [phone]   Fax:  [phone]  Name: Caitlin Reed MRN: [id number] Date of Birth: 1935-12-27

## 2020-01-07 NOTE — Patient Instructions (Signed)
Access Code: [email or web address] URL: [email or web address] Date: 01/07/2020 Prepared by: Margie Billet  Exercises Seated March - 3 x daily - 7 x weekly - 2 sets - 10 reps Seated Long Arc Quad - 3 x daily - 7 x weekly - 2 sets - 10 reps - 5-10 second hold Seated Heel Toe Raises - 3 x daily - 7 x weekly - 2 sets - 10 reps Supine Active Ankle Pumps - 3 x daily - 7 x weekly - 2 sets - 10 reps

## 2020-01-14 ENCOUNTER — Encounter (HOSPITAL_COMMUNITY): Payer: Self-pay

## 2020-01-14 ENCOUNTER — Ambulatory Visit (HOSPITAL_COMMUNITY): Payer: PPO

## 2020-01-14 ENCOUNTER — Other Ambulatory Visit: Payer: Self-pay

## 2020-01-14 DIAGNOSIS — R2689 Other abnormalities of gait and mobility: Secondary | ICD-10-CM

## 2020-01-14 DIAGNOSIS — M6281 Muscle weakness (generalized): Secondary | ICD-10-CM

## 2020-01-14 DIAGNOSIS — R29898 Other symptoms and signs involving the musculoskeletal system: Secondary | ICD-10-CM

## 2020-01-14 DIAGNOSIS — M25551 Pain in right hip: Secondary | ICD-10-CM

## 2020-01-14 NOTE — Therapy (Signed)
[place], [place], [address] Phone: [phone]   Fax:  [phone]  Physical Therapy Treatment  Patient Details  Name: SANDEEP DELAGARZA MRN: [id number] Date of Birth: 05-30-1936 Referring Provider (PT): Carlis Abbott NP   Encounter Date: 01/14/2020   PT End of Session - 01/14/20 1704    Visit Number 2    Number of Visits 12    Date for PT Re-Evaluation 02/18/20    Authorization Type [employer] (no auth, no visit limit)    Progress Note Due on Visit 10    PT Start Time 1622    PT Stop Time 1700    PT Time Calculation (min) 38 min    Equipment Utilized During Treatment Gait belt    Activity Tolerance Patient tolerated treatment well;Patient limited by fatigue    Behavior During Therapy [facility] for tasks assessed/performed           Past Medical History:  Diagnosis Date  . Cardiomyopathy ([place]) 01/29/2019   a. EF 35% by echo in 01/2019 at the time of admission for gastroenteritis/duodenitis b. NST in 03/2019 showing no ischemia --> low-risk study b. EF normalized to 55-60% by repeat echo in 05/2019  . Essential hypertension   . Mixed hyperlipidemia   . Type 2 diabetes mellitus ([place])     Past Surgical History:  Procedure Laterality Date  . CARDIAC CATHETERIZATION  2002   nl cors, nl EF  . COLONOSCOPY N/A 11/26/2013   Procedure: COLONOSCOPY;  Surgeon: Rogene Houston, MD;  [facility];  Service: Endoscopy;  Laterality: N/A;  915  . ESOPHAGOGASTRODUODENOSCOPY N/A 11/26/2013   Procedure: ESOPHAGOGASTRODUODENOSCOPY (EGD);  Surgeon: Rogene Houston, MD;  [facility];  Service: Endoscopy;  Laterality: N/A;  . EYE SURGERY      There were no vitals filed for this visit.   Subjective Assessment - 01/14/20 1623    Subjective Pt arrived with daughter walking with 4 wheel walker.  Pt stated she has trouble straighten up and stand tall.    Patient Stated Goals walking better     Currently in Pain? Yes    Pain Score 3     Pain Location Hip    Pain Orientation Right    Pain Descriptors / Indicators Aching;Sore    Pain Type Chronic pain    Pain Onset More than a month ago    Pain Frequency Intermittent                             [facility] Adult PT Treatment/Exercise - 01/14/20 0001      Exercises   Exercises Knee/Hip      Knee/Hip Exercises: Standing   Gait Training 25 x 3 with max cueing for proper handplacement and standing within walker.    Other Standing Knee Exercises Pt educated on importance of standing within walker    Other Standing Knee Exercises Standing tall with standard walker      Knee/Hip Exercises: Seated   Long Arc Quad Both;1 set;10 reps    Long Arc Quad Limitations 10 seconds    Other Seated Knee/Hip Exercises heel/toe raises 2x10    Marching Both;1 set;10 reps    Sit to [employer] 2 sets;5 reps;with UE support                  PT Education - 01/14/20 1834    Education Details Reviewed goals,  assured compliance iwth HEP, educated proper gait mechanicns, posture and importance of standing within walker    Person(s) Educated Patient;Child(ren)   daughter   Methods Explanation;Demonstration;Tactile cues;Verbal cues    Comprehension Verbal cues required;Tactile cues required;Need further instruction            PT Short Term Goals - 01/07/20 1453      PT SHORT TERM GOAL #1   Title Patient will be independent with HEP in order to improve functional outcomes.    Time 3    Period Weeks    Status New    Target Date 01/28/20      PT SHORT TERM GOAL #2   Title Patient will ambulate with rollator with proper mechanics in order to improve safety with ambulation.    Time 3    Period Weeks    Status New    Target Date 01/28/20             PT Long Term Goals - 01/07/20 1454      PT LONG TERM GOAL #1   Title Patient will report at least 75% improvement in symptoms for improved quality of life.    Time 6     Period Weeks    Status New    Target Date 02/18/20      PT LONG TERM GOAL #2   Title Patient will be able to ambulate at least 160 feet in 2MWT in order to demonstrate improved tolerance to activity.    Time 6    Period Weeks    Status New    Target Date 02/18/20      PT LONG TERM GOAL #3   Title Patient will be able to perform sit to stand without UE use in order to demonstrate improved functional strength.    Time 6    Period Weeks    Status New    Target Date 02/18/20                 Plan - 01/14/20 1835    Clinical Impression Statement Pt arrived with daughter for session.  Pt presents with improper posture, standing far away from walker with Rt forearm and Lt hand onto rollator seat.  Pt explained importance of posture to reduce risk of falling.  Encouraged daughter to switch from 4 to 2 wheeled rollator or smaller wheels to reduce speed for fall prevention.  Pt unable to demonstrate proper gait mechanics this session with mutlimodal demonstrate, verbal and tactile cueing.  Pt stated "she feels safer this way."    Personal Factors and Comorbidities Age;Comorbidity 3+;Past/Current Experience;Fitness;Time since onset of injury/illness/exacerbation    Comorbidities DM, CHF, sedentary lifestyle, obesity    Examination-Activity Limitations Bed Mobility;Transfers;Locomotion Level;Squat;Stairs;Stand;Lift;Carry;Bend    Examination-Participation Restrictions Lawyer;Shop;Interpersonal Relationship;Community Activity;Cleaning    Stability/Clinical Decision Making Stable/Uncomplicated    Clinical Decision Making Low    Rehab Potential Fair    PT Frequency 2x / week    PT Duration 6 weeks    PT Treatment/Interventions ADLs/Self Care Home Management;Aquatic Therapy;Biofeedback;Cryotherapy;Electrical Stimulation;Moist Heat;Iontophoresis 4mg /ml Dexamethasone;Traction;Ultrasound;DME Instruction;Gait training;Stair training;Functional mobility training;Therapeutic  activities;Therapeutic exercise;Balance training;Neuromuscular re-education;Patient/family education;Manual techniques;Manual lymph drainage;Compression bandaging;Orthotic Fit/Training;Dry needling;Joint Manipulations;Energy conservation;Splinting;Taping;Vasopneumatic Device    PT Next Visit Plan balance and gait training, continue LE strengthing and mobility exercises and progress as able    PT Home Exercise Plan 6/9 Seated marching, LAQ, ankle pumps, heel/toe raises           Patient will benefit from skilled therapeutic intervention in  order to improve the following deficits and impairments:  Abnormal gait, Decreased range of motion, Difficulty walking, Decreased endurance, Pain, Decreased activity tolerance, Decreased balance, Decreased knowledge of use of DME, Decreased mobility, Decreased strength, Increased edema, Postural dysfunction, Improper body mechanics  Visit Diagnosis: Other abnormalities of gait and mobility  Other symptoms and signs involving the musculoskeletal system  Pain in right hip  Muscle weakness (generalized)     Problem List Patient Active Problem List   Diagnosis Date Noted  . Elevated troponin 01/30/2019  . AKI (acute kidney injury) ([place])   . Duodenitis   . Ischemic cardiomyopathy   . Elevated troponin I level 01/29/2019  . Glaucoma 01/29/2019  . Mixed hyperlipidemia   . Essential hypertension   . Elevated serum creatinine   . Hypokalemia   . Acute gastroenteritis   . Unilateral primary osteoarthritis, right knee 03/27/2018  . Anemia, iron deficiency 11/13/2013  . High cholesterol 11/13/2013  . Type 2 diabetes mellitus ([place]) 11/13/2013   Ihor Austin, LPTA/CLT; CBIS [phone]  Aldona Lento 01/14/2020, [place] [address] [place], [place], [address] Phone: [phone]   Fax:  [phone]  Name: SHAKIYAH CIRILO MRN: [id number] Date of Birth: 1935/10/31

## 2020-01-15 ENCOUNTER — Ambulatory Visit (HOSPITAL_COMMUNITY): Payer: PPO

## 2020-01-15 ENCOUNTER — Telehealth (HOSPITAL_COMMUNITY): Payer: Self-pay

## 2020-01-15 NOTE — Telephone Encounter (Signed)
Pt cx stating her hip is hurting too much

## 2020-01-21 ENCOUNTER — Ambulatory Visit (HOSPITAL_COMMUNITY): Payer: PPO | Admitting: Physical Therapy

## 2020-01-21 ENCOUNTER — Other Ambulatory Visit: Payer: Self-pay

## 2020-01-21 ENCOUNTER — Encounter (HOSPITAL_COMMUNITY): Payer: Self-pay | Admitting: Physical Therapy

## 2020-01-21 DIAGNOSIS — M6281 Muscle weakness (generalized): Secondary | ICD-10-CM

## 2020-01-21 DIAGNOSIS — M25551 Pain in right hip: Secondary | ICD-10-CM

## 2020-01-21 DIAGNOSIS — R2689 Other abnormalities of gait and mobility: Secondary | ICD-10-CM

## 2020-01-21 DIAGNOSIS — R29898 Other symptoms and signs involving the musculoskeletal system: Secondary | ICD-10-CM

## 2020-01-21 NOTE — Therapy (Signed)
[place], [place], [address] Phone: [phone]   Fax:  [phone]  Physical Therapy Treatment  Patient Details  Name: Caitlin Reed MRN: [id number] Date of Birth: 12/03/1935 Referring Provider (PT): Carlis Abbott NP   Encounter Date: 01/21/2020   PT End of Session - 01/21/20 1356    Visit Number 3    Number of Visits 12    Date for PT Re-Evaluation 02/18/20    Authorization Type [employer] (no auth, no visit limit)    Progress Note Due on Visit 10    PT Start Time 1346    PT Stop Time 1426    PT Time Calculation (min) 40 min    Equipment Utilized During Treatment Gait belt    Activity Tolerance Patient tolerated treatment well;Patient limited by fatigue    Behavior During Therapy [facility] for tasks assessed/performed           Past Medical History:  Diagnosis Date  . Cardiomyopathy ([place]) 01/29/2019   a. EF 35% by echo in 01/2019 at the time of admission for gastroenteritis/duodenitis b. NST in 03/2019 showing no ischemia --> low-risk study b. EF normalized to 55-60% by repeat echo in 05/2019  . Essential hypertension   . Mixed hyperlipidemia   . Type 2 diabetes mellitus ([place])     Past Surgical History:  Procedure Laterality Date  . CARDIAC CATHETERIZATION  2002   nl cors, nl EF  . COLONOSCOPY N/A 11/26/2013   Procedure: COLONOSCOPY;  Surgeon: Rogene Houston, MD;  [facility];  Service: Endoscopy;  Laterality: N/A;  915  . ESOPHAGOGASTRODUODENOSCOPY N/A 11/26/2013   Procedure: ESOPHAGOGASTRODUODENOSCOPY (EGD);  Surgeon: Rogene Houston, MD;  Location: [facility];  Service: Endoscopy;  Laterality: N/A;  . EYE SURGERY      There were no vitals filed for this visit.   Subjective Assessment - 01/21/20 1346    Subjective Patient denies any falls. She was exhausted after last session and couldn't get up the next day. She does not do her exercises much.    Patient Stated  Goals walking better    Currently in Pain? No/denies    Pain Onset More than a month ago                             [facility] Adult PT Treatment/Exercise - 01/21/20 0001      Ambulation/Gait   Ambulation/Gait Yes    Ambulation/Gait Assistance 4: Min guard    Ambulation Distance (Feet) 50 Feet    Assistive device 4-wheeled walker    Gait Pattern Decreased step length - right;Decreased step length - left;Decreased stance time - right;Trunk flexed;Wide base of support;Poor foot clearance - left;Poor foot clearance - right    Ambulation Surface Level;Indoor    Gait Comments verbal cueing for posture, mechanics, hand placement      Knee/Hip Exercises: Standing   Heel Raises 2 sets;10 reps      Knee/Hip Exercises: Seated   Long Arc Quad Both;1 set;10 reps    Long Arc Quad Limitations 10 seconds    Other Seated Knee/Hip Exercises heel/toe raises 2x10    Other Seated Knee/Hip Exercises hamstring isometrics in seated 10x 5 second holds    Marching Both;1 set;10 reps    Abd/Adduction Limitations isometrics with ball for adduction 10x10 second holds; abduction with band 10x 5 second holds  PT Education - 01/21/20 1351    Education Details Review of HEP, completing HEP, exericse adn gait mechanics.    Person(s) Educated Patient;Child(ren)    Methods Explanation;Demonstration    Comprehension Verbalized understanding;Returned demonstration            PT Short Term Goals - 01/07/20 1453      PT SHORT TERM GOAL #1   Title Patient will be independent with HEP in order to improve functional outcomes.    Time 3    Period Weeks    Status New    Target Date 01/28/20      PT SHORT TERM GOAL #2   Title Patient will ambulate with rollator with proper mechanics in order to improve safety with ambulation.    Time 3    Period Weeks    Status New    Target Date 01/28/20             PT Long Term Goals - 01/07/20 1454      PT LONG TERM GOAL #1    Title Patient will report at least 75% improvement in symptoms for improved quality of life.    Time 6    Period Weeks    Status New    Target Date 02/18/20      PT LONG TERM GOAL #2   Title Patient will be able to ambulate at least 160 feet in 2MWT in order to demonstrate improved tolerance to activity.    Time 6    Period Weeks    Status New    Target Date 02/18/20      PT LONG TERM GOAL #3   Title Patient will be able to perform sit to stand without UE use in order to demonstrate improved functional strength.    Time 6    Period Weeks    Status New    Target Date 02/18/20                 Plan - 01/21/20 1357    Clinical Impression Statement Patient unable to fully extend with ambulation and requires frequent verbal cueing and reassurance to keep both hands on rollator handles vs. seat. She fatigues quickly with ambulation and is limited secondary to fatigue and R hip pain. She continues to stand hunched over will all exercises despite cueing for posture. She requires frequent verbal cueing for timing of exercises and requires several seated breaks secondary to fatigue. Patient will continue to benefit from skilled physical therapy in order to improve function and reduce impairment.    Personal Factors and Comorbidities Age;Comorbidity 3+;Past/Current Experience;Fitness;Time since onset of injury/illness/exacerbation    Comorbidities DM, CHF, sedentary lifestyle, obesity    Examination-Activity Limitations Bed Mobility;Transfers;Locomotion Level;Squat;Stairs;Stand;Lift;Carry;Bend    Examination-Participation Restrictions Lawyer;Shop;Interpersonal Relationship;Community Activity;Cleaning    Stability/Clinical Decision Making Stable/Uncomplicated    Rehab Potential Fair    PT Frequency 2x / week    PT Duration 6 weeks    PT Treatment/Interventions ADLs/Self Care Home Management;Aquatic Therapy;Biofeedback;Cryotherapy;Electrical Stimulation;Moist  Heat;Iontophoresis 4mg /ml Dexamethasone;Traction;Ultrasound;DME Instruction;Gait training;Stair training;Functional mobility training;Therapeutic activities;Therapeutic exercise;Balance training;Neuromuscular re-education;Patient/family education;Manual techniques;Manual lymph drainage;Compression bandaging;Orthotic Fit/Training;Dry needling;Joint Manipulations;Energy conservation;Splinting;Taping;Vasopneumatic Device    PT Next Visit Plan balance and gait training, continue LE strengthing and mobility exercises and progress as able    PT Home Exercise Plan 6/9 Seated marching, LAQ, ankle pumps, heel/toe raises           Patient will benefit from skilled therapeutic intervention in order to improve the following deficits and impairments:  Abnormal gait, Decreased range of motion, Difficulty walking, Decreased endurance, Pain, Decreased activity tolerance, Decreased balance, Decreased knowledge of use of DME, Decreased mobility, Decreased strength, Increased edema, Postural dysfunction, Improper body mechanics  Visit Diagnosis: Other abnormalities of gait and mobility  Other symptoms and signs involving the musculoskeletal system  Pain in right hip  Muscle weakness (generalized)     Problem List Patient Active Problem List   Diagnosis Date Noted  . Elevated troponin 01/30/2019  . AKI (acute kidney injury) ([place])   . Duodenitis   . Ischemic cardiomyopathy   . Elevated troponin I level 01/29/2019  . Glaucoma 01/29/2019  . Mixed hyperlipidemia   . Essential hypertension   . Elevated serum creatinine   . Hypokalemia   . Acute gastroenteritis   . Unilateral primary osteoarthritis, right knee 03/27/2018  . Anemia, iron deficiency 11/13/2013  . High cholesterol 11/13/2013  . Type 2 diabetes mellitus ([place]) 11/13/2013   2:31 PM, 01/21/20 Mearl Latin PT, DPT Physical Therapist at [place], [place], [address] Phone: [phone]   Fax:  [phone]  Name: Caitlin Reed MRN: [id number] Date of Birth: 28-Feb-1936

## 2020-01-22 ENCOUNTER — Ambulatory Visit (HOSPITAL_COMMUNITY): Payer: PPO | Admitting: Physical Therapy

## 2020-01-26 ENCOUNTER — Ambulatory Visit (HOSPITAL_COMMUNITY): Payer: PPO | Admitting: Physical Therapy

## 2020-01-26 ENCOUNTER — Encounter (HOSPITAL_COMMUNITY): Payer: Self-pay | Admitting: Physical Therapy

## 2020-01-26 ENCOUNTER — Other Ambulatory Visit: Payer: Self-pay

## 2020-01-26 DIAGNOSIS — R29898 Other symptoms and signs involving the musculoskeletal system: Secondary | ICD-10-CM

## 2020-01-26 DIAGNOSIS — M25551 Pain in right hip: Secondary | ICD-10-CM

## 2020-01-26 DIAGNOSIS — M6281 Muscle weakness (generalized): Secondary | ICD-10-CM | POA: Diagnosis not present

## 2020-01-26 DIAGNOSIS — R2689 Other abnormalities of gait and mobility: Secondary | ICD-10-CM

## 2020-01-26 NOTE — Therapy (Signed)
[place], [place], [address] Phone: [phone]   Fax:  [phone]  Physical Therapy Treatment  Patient Details  Name: Caitlin Reed MRN: [id number] Date of Birth: Feb 09, 1936 Referring Provider (PT): Carlis Abbott NP   Encounter Date: 01/26/2020   PT End of Session - 01/26/20 1258    Visit Number 4    Number of Visits 12    Date for PT Re-Evaluation 02/18/20    Authorization Type [employer] (no auth, no visit limit)    Progress Note Due on Visit 10    PT Start Time 1259    PT Stop Time 1338    PT Time Calculation (min) 39 min    Equipment Utilized During Treatment Gait belt    Activity Tolerance Patient tolerated treatment well;Patient limited by fatigue    Behavior During Therapy [facility] for tasks assessed/performed           Past Medical History:  Diagnosis Date  . Cardiomyopathy ([place]) 01/29/2019   a. EF 35% by echo in 01/2019 at the time of admission for gastroenteritis/duodenitis b. NST in 03/2019 showing no ischemia --> low-risk study b. EF normalized to 55-60% by repeat echo in 05/2019  . Essential hypertension   . Mixed hyperlipidemia   . Type 2 diabetes mellitus ([place])     Past Surgical History:  Procedure Laterality Date  . CARDIAC CATHETERIZATION  2002   nl cors, nl EF  . COLONOSCOPY N/A 11/26/2013   Procedure: COLONOSCOPY;  Surgeon: Rogene Houston, MD;  [facility];  Service: Endoscopy;  Laterality: N/A;  915  . ESOPHAGOGASTRODUODENOSCOPY N/A 11/26/2013   Procedure: ESOPHAGOGASTRODUODENOSCOPY (EGD);  Surgeon: Rogene Houston, MD;  [facility];  Service: Endoscopy;  Laterality: N/A;  . EYE SURGERY      There were no vitals filed for this visit.   Subjective Assessment - 01/26/20 1255    Subjective Patient can't remember is she went to [place] to check her legs or not. She states she has been moving her legs and doing some of the exercises.    Patient  Stated Goals walking better    Currently in Pain? No/denies    Pain Onset More than a month ago                             [facility] Adult PT Treatment/Exercise - 01/26/20 0001      Ambulation/Gait   Ambulation/Gait Yes    Ambulation/Gait Assistance 4: Min guard    Ambulation Distance (Feet) 50 Feet    Assistive device 4-wheeled walker    Gait Pattern Decreased step length - right;Decreased step length - left;Decreased stance time - right;Trunk flexed;Wide base of support;Poor foot clearance - left;Poor foot clearance - right    Ambulation Surface Level;Indoor    Gait Comments verbal cueing for posture, mechanics, hand placement      Knee/Hip Exercises: Standing   Heel Raises 2 sets;10 reps    Other Standing Knee Exercises stair taps 2x10 bilateral      Knee/Hip Exercises: Seated   Long Arc Quad Both;1 set;10 reps    Long Arc Quad Limitations 10 seconds    Other Seated Knee/Hip Exercises heel/toe raises 2x10    Other Seated Knee/Hip Exercises hamstring isometrics in seated with ball 2x 10 5 second holds    Marching Both;1 set;10 reps    Abd/Adduction Limitations isometrics with ball for  adduction 10x10 second holds; abduction with band 10x 5 second holds    Sit to [place] 2 sets;5 reps;with UE support                  PT Education - 01/26/20 1258    Education Details Review of HEP, completing HEP, exericse and gait mechanics.    Person(s) Educated Patient    Methods Explanation;Demonstration    Comprehension Verbalized understanding;Returned demonstration            PT Short Term Goals - 01/07/20 1453      PT SHORT TERM GOAL #1   Title Patient will be independent with HEP in order to improve functional outcomes.    Time 3    Period Weeks    Status New    Target Date 01/28/20      PT SHORT TERM GOAL #2   Title Patient will ambulate with rollator with proper mechanics in order to improve safety with ambulation.    Time 3    Period Weeks     Status New    Target Date 01/28/20             PT Long Term Goals - 01/07/20 1454      PT LONG TERM GOAL #1   Title Patient will report at least 75% improvement in symptoms for improved quality of life.    Time 6    Period Weeks    Status New    Target Date 02/18/20      PT LONG TERM GOAL #2   Title Patient will be able to ambulate at least 160 feet in 2MWT in order to demonstrate improved tolerance to activity.    Time 6    Period Weeks    Status New    Target Date 02/18/20      PT LONG TERM GOAL #3   Title Patient will be able to perform sit to stand without UE use in order to demonstrate improved functional strength.    Time 6    Period Weeks    Status New    Target Date 02/18/20                 Plan - 01/26/20 1258    Clinical Impression Statement Patient continues to have difficulty with upright positioning with ambulation with RW but does hold on to handles instead of seat with verbal cueing. Patient limited by fatigue and hip pain while ambulating. She has difficulty with stair taps secondary to impaired LE clearance and hip strength and is limited with R hip pain. She requires frequent verbal cueing for posture throughout session. She fatigues quickly and requires frequent rest breaks throughout session. Patient requires frequent verbal cueing for hold times of isometric exercises.  Patient requires UE support to transfer to standing with RW and verbal cueing for posture. She has c/o intermittent UE numbness. Patient will continue to benefit from skilled physical therapy in order to reduce impairment and improve function.    Personal Factors and Comorbidities Age;Comorbidity 3+;Past/Current Experience;Fitness;Time since onset of injury/illness/exacerbation    Comorbidities DM, CHF, sedentary lifestyle, obesity    Examination-Activity Limitations Bed Mobility;Transfers;Locomotion Level;Squat;Stairs;Stand;Lift;Carry;Bend    Examination-Participation Restrictions  Lawyer;Shop;Interpersonal Relationship;Community Activity;Cleaning    Stability/Clinical Decision Making Stable/Uncomplicated    Rehab Potential Fair    PT Frequency 2x / week    PT Duration 6 weeks    PT Treatment/Interventions ADLs/Self Care Home Management;Aquatic Therapy;Biofeedback;Cryotherapy;Electrical Stimulation;Moist Heat;Iontophoresis 4mg /ml Dexamethasone;Traction;Ultrasound;DME Instruction;Gait training;Stair training;Functional mobility  training;Therapeutic activities;Therapeutic exercise;Balance training;Neuromuscular re-education;Patient/family education;Manual techniques;Manual lymph drainage;Compression bandaging;Orthotic Fit/Training;Dry needling;Joint Manipulations;Energy conservation;Splinting;Taping;Vasopneumatic Device    PT Next Visit Plan balance and gait training, continue LE strengthing and mobility exercises and progress as able    PT Home Exercise Plan 6/9 Seated marching, LAQ, ankle pumps, heel/toe raises           Patient will benefit from skilled therapeutic intervention in order to improve the following deficits and impairments:  Abnormal gait, Decreased range of motion, Difficulty walking, Decreased endurance, Pain, Decreased activity tolerance, Decreased balance, Decreased knowledge of use of DME, Decreased mobility, Decreased strength, Increased edema, Postural dysfunction, Improper body mechanics  Visit Diagnosis: Other abnormalities of gait and mobility  Other symptoms and signs involving the musculoskeletal system  Pain in right hip  Muscle weakness (generalized)     Problem List Patient Active Problem List   Diagnosis Date Noted  . Elevated troponin 01/30/2019  . AKI (acute kidney injury) ([place])   . Duodenitis   . Ischemic cardiomyopathy   . Elevated troponin I level 01/29/2019  . Glaucoma 01/29/2019  . Mixed hyperlipidemia   . Essential hypertension   . Elevated serum creatinine   . Hypokalemia   . Acute gastroenteritis   .  Unilateral primary osteoarthritis, right knee 03/27/2018  . Anemia, iron deficiency 11/13/2013  . High cholesterol 11/13/2013  . Type 2 diabetes mellitus ([place]) 11/13/2013    1:44 PM, 01/26/20 Mearl Latin PT, DPT Physical Therapist at [place], [place], [address] Phone: [phone]   Fax:  [phone]  Name: Caitlin Reed MRN: [id number] Date of Birth: 1936/06/16

## 2020-01-29 ENCOUNTER — Ambulatory Visit (HOSPITAL_COMMUNITY): Payer: PPO | Admitting: Physical Therapy

## 2020-01-29 ENCOUNTER — Encounter (HOSPITAL_COMMUNITY): Payer: PPO | Admitting: Physical Therapy

## 2020-01-29 ENCOUNTER — Telehealth (HOSPITAL_COMMUNITY): Payer: Self-pay | Admitting: Physical Therapy

## 2020-01-29 NOTE — Telephone Encounter (Signed)
Pt called to cx today no reason given

## 2020-01-30 ENCOUNTER — Telehealth (HOSPITAL_COMMUNITY): Payer: Self-pay | Admitting: Physical Therapy

## 2020-01-30 DIAGNOSIS — L851 Acquired keratosis [keratoderma] palmaris et plantaris: Secondary | ICD-10-CM | POA: Diagnosis not present

## 2020-01-30 DIAGNOSIS — E1142 Type 2 diabetes mellitus with diabetic polyneuropathy: Secondary | ICD-10-CM | POA: Diagnosis not present

## 2020-01-30 DIAGNOSIS — B351 Tinea unguium: Secondary | ICD-10-CM | POA: Diagnosis not present

## 2020-01-30 NOTE — Telephone Encounter (Signed)
pt has cancelled her remaining appts because she does not want to continue therapy

## 2020-02-03 ENCOUNTER — Encounter (HOSPITAL_COMMUNITY): Payer: PPO | Admitting: Physical Therapy

## 2020-02-03 ENCOUNTER — Encounter (HOSPITAL_COMMUNITY): Payer: Self-pay | Admitting: Physical Therapy

## 2020-02-03 NOTE — Therapy (Signed)
[place], [place], [address] Phone: [phone]   Fax:  [phone]  Patient Details  Name: Caitlin Reed MRN: [id number] Date of Birth: 05/18/1936 Referring Provider:  No ref. provider found  Encounter Date: 02/03/2020   PHYSICAL THERAPY DISCHARGE SUMMARY  Visits from Start of Care: 4  Current functional level related to goals / functional outcomes: Patient has not met any goals at this time.   Remaining deficits: Patient continues continues to be limited with gait, strength, transfers, pain and functional mobility.   Education / Equipment: Educated on [employer], exercise mechanics, gait mechanics, safe use of AD.  Plan: Patient agrees to discharge.  Patient goals were not met. Patient is being discharged due to the patient's request.  ?????       9:22 AM, 02/03/20 Mearl Latin PT, DPT Physical Therapist at [place], [place], [address] Phone: [phone]   Fax:  [phone]

## 2020-02-05 ENCOUNTER — Encounter (HOSPITAL_COMMUNITY): Payer: PPO | Admitting: Physical Therapy

## 2020-02-09 ENCOUNTER — Encounter (HOSPITAL_COMMUNITY): Payer: PPO | Admitting: Physical Therapy

## 2020-02-11 ENCOUNTER — Encounter (HOSPITAL_COMMUNITY): Payer: PPO | Admitting: Physical Therapy

## 2020-02-16 ENCOUNTER — Encounter (HOSPITAL_COMMUNITY): Payer: PPO | Admitting: Physical Therapy

## 2020-02-18 ENCOUNTER — Encounter (HOSPITAL_COMMUNITY): Payer: PPO | Admitting: Physical Therapy

## 2020-03-10 DIAGNOSIS — F0390 Unspecified dementia without behavioral disturbance: Secondary | ICD-10-CM | POA: Diagnosis not present

## 2020-03-10 DIAGNOSIS — E11319 Type 2 diabetes mellitus with unspecified diabetic retinopathy without macular edema: Secondary | ICD-10-CM | POA: Diagnosis not present

## 2020-03-10 DIAGNOSIS — W19XXXD Unspecified fall, subsequent encounter: Secondary | ICD-10-CM | POA: Diagnosis not present

## 2020-03-10 DIAGNOSIS — N183 Chronic kidney disease, stage 3 unspecified: Secondary | ICD-10-CM | POA: Diagnosis not present

## 2020-03-22 DIAGNOSIS — E1142 Type 2 diabetes mellitus with diabetic polyneuropathy: Secondary | ICD-10-CM | POA: Diagnosis not present

## 2020-05-04 DIAGNOSIS — L851 Acquired keratosis [keratoderma] palmaris et plantaris: Secondary | ICD-10-CM | POA: Diagnosis not present

## 2020-05-04 DIAGNOSIS — B351 Tinea unguium: Secondary | ICD-10-CM | POA: Diagnosis not present

## 2020-05-04 DIAGNOSIS — E1142 Type 2 diabetes mellitus with diabetic polyneuropathy: Secondary | ICD-10-CM | POA: Diagnosis not present

## 2020-05-12 DIAGNOSIS — E559 Vitamin D deficiency, unspecified: Secondary | ICD-10-CM | POA: Diagnosis not present

## 2020-05-12 DIAGNOSIS — N170 Acute kidney failure with tubular necrosis: Secondary | ICD-10-CM | POA: Diagnosis not present

## 2020-05-12 DIAGNOSIS — N189 Chronic kidney disease, unspecified: Secondary | ICD-10-CM | POA: Diagnosis not present

## 2020-05-12 DIAGNOSIS — E873 Alkalosis: Secondary | ICD-10-CM | POA: Diagnosis not present

## 2020-05-12 DIAGNOSIS — E1122 Type 2 diabetes mellitus with diabetic chronic kidney disease: Secondary | ICD-10-CM | POA: Diagnosis not present

## 2020-05-12 DIAGNOSIS — I5042 Chronic combined systolic (congestive) and diastolic (congestive) heart failure: Secondary | ICD-10-CM | POA: Diagnosis not present

## 2020-05-12 DIAGNOSIS — E876 Hypokalemia: Secondary | ICD-10-CM | POA: Diagnosis not present

## 2020-05-12 DIAGNOSIS — D638 Anemia in other chronic diseases classified elsewhere: Secondary | ICD-10-CM | POA: Diagnosis not present

## 2020-05-12 DIAGNOSIS — Z79899 Other long term (current) drug therapy: Secondary | ICD-10-CM | POA: Diagnosis not present

## 2020-05-17 DIAGNOSIS — F0390 Unspecified dementia without behavioral disturbance: Secondary | ICD-10-CM | POA: Diagnosis not present

## 2020-05-17 DIAGNOSIS — E1122 Type 2 diabetes mellitus with diabetic chronic kidney disease: Secondary | ICD-10-CM | POA: Diagnosis not present

## 2020-05-17 DIAGNOSIS — N183 Chronic kidney disease, stage 3 unspecified: Secondary | ICD-10-CM | POA: Diagnosis not present

## 2020-05-17 DIAGNOSIS — E1165 Type 2 diabetes mellitus with hyperglycemia: Secondary | ICD-10-CM | POA: Diagnosis not present

## 2020-05-19 DIAGNOSIS — N189 Chronic kidney disease, unspecified: Secondary | ICD-10-CM | POA: Diagnosis not present

## 2020-05-19 DIAGNOSIS — D638 Anemia in other chronic diseases classified elsewhere: Secondary | ICD-10-CM | POA: Diagnosis not present

## 2020-05-19 DIAGNOSIS — E876 Hypokalemia: Secondary | ICD-10-CM | POA: Diagnosis not present

## 2020-05-19 DIAGNOSIS — N170 Acute kidney failure with tubular necrosis: Secondary | ICD-10-CM | POA: Diagnosis not present

## 2020-05-19 DIAGNOSIS — E1122 Type 2 diabetes mellitus with diabetic chronic kidney disease: Secondary | ICD-10-CM | POA: Diagnosis not present

## 2020-05-19 DIAGNOSIS — I5042 Chronic combined systolic (congestive) and diastolic (congestive) heart failure: Secondary | ICD-10-CM | POA: Diagnosis not present

## 2020-05-19 DIAGNOSIS — Z79899 Other long term (current) drug therapy: Secondary | ICD-10-CM | POA: Diagnosis not present

## 2020-05-19 DIAGNOSIS — E873 Alkalosis: Secondary | ICD-10-CM | POA: Diagnosis not present

## 2020-06-21 DIAGNOSIS — I5042 Chronic combined systolic (congestive) and diastolic (congestive) heart failure: Secondary | ICD-10-CM | POA: Diagnosis not present

## 2020-06-21 DIAGNOSIS — N189 Chronic kidney disease, unspecified: Secondary | ICD-10-CM | POA: Diagnosis not present

## 2020-06-21 DIAGNOSIS — E871 Hypo-osmolality and hyponatremia: Secondary | ICD-10-CM | POA: Diagnosis not present

## 2020-06-21 DIAGNOSIS — E876 Hypokalemia: Secondary | ICD-10-CM | POA: Diagnosis not present

## 2020-06-21 DIAGNOSIS — E211 Secondary hyperparathyroidism, not elsewhere classified: Secondary | ICD-10-CM | POA: Diagnosis not present

## 2020-06-21 DIAGNOSIS — E1122 Type 2 diabetes mellitus with diabetic chronic kidney disease: Secondary | ICD-10-CM | POA: Diagnosis not present

## 2020-07-13 DIAGNOSIS — E1142 Type 2 diabetes mellitus with diabetic polyneuropathy: Secondary | ICD-10-CM | POA: Diagnosis not present

## 2020-07-13 DIAGNOSIS — B351 Tinea unguium: Secondary | ICD-10-CM | POA: Diagnosis not present

## 2020-08-10 DIAGNOSIS — E1165 Type 2 diabetes mellitus with hyperglycemia: Secondary | ICD-10-CM | POA: Diagnosis not present

## 2020-08-10 DIAGNOSIS — I10 Essential (primary) hypertension: Secondary | ICD-10-CM | POA: Diagnosis not present

## 2020-08-10 DIAGNOSIS — E1122 Type 2 diabetes mellitus with diabetic chronic kidney disease: Secondary | ICD-10-CM | POA: Diagnosis not present

## 2020-08-10 DIAGNOSIS — N183 Chronic kidney disease, stage 3 unspecified: Secondary | ICD-10-CM | POA: Diagnosis not present

## 2020-08-10 DIAGNOSIS — F0390 Unspecified dementia without behavioral disturbance: Secondary | ICD-10-CM | POA: Diagnosis not present

## 2020-08-10 DIAGNOSIS — E782 Mixed hyperlipidemia: Secondary | ICD-10-CM | POA: Diagnosis not present

## 2020-08-10 DIAGNOSIS — E1129 Type 2 diabetes mellitus with other diabetic kidney complication: Secondary | ICD-10-CM | POA: Diagnosis not present

## 2020-08-25 DIAGNOSIS — N183 Chronic kidney disease, stage 3 unspecified: Secondary | ICD-10-CM | POA: Diagnosis not present

## 2020-08-25 DIAGNOSIS — F0390 Unspecified dementia without behavioral disturbance: Secondary | ICD-10-CM | POA: Diagnosis not present

## 2020-08-25 DIAGNOSIS — E162 Hypoglycemia, unspecified: Secondary | ICD-10-CM | POA: Diagnosis not present

## 2020-08-25 DIAGNOSIS — E1165 Type 2 diabetes mellitus with hyperglycemia: Secondary | ICD-10-CM | POA: Diagnosis not present

## 2020-09-22 DIAGNOSIS — N170 Acute kidney failure with tubular necrosis: Secondary | ICD-10-CM | POA: Diagnosis not present

## 2020-09-22 DIAGNOSIS — E1122 Type 2 diabetes mellitus with diabetic chronic kidney disease: Secondary | ICD-10-CM | POA: Diagnosis not present

## 2020-09-22 DIAGNOSIS — E211 Secondary hyperparathyroidism, not elsewhere classified: Secondary | ICD-10-CM | POA: Diagnosis not present

## 2020-09-22 DIAGNOSIS — I129 Hypertensive chronic kidney disease with stage 1 through stage 4 chronic kidney disease, or unspecified chronic kidney disease: Secondary | ICD-10-CM | POA: Diagnosis not present

## 2020-09-22 DIAGNOSIS — E876 Hypokalemia: Secondary | ICD-10-CM | POA: Diagnosis not present

## 2020-09-22 DIAGNOSIS — N189 Chronic kidney disease, unspecified: Secondary | ICD-10-CM | POA: Diagnosis not present

## 2020-09-22 DIAGNOSIS — I5042 Chronic combined systolic (congestive) and diastolic (congestive) heart failure: Secondary | ICD-10-CM | POA: Diagnosis not present

## 2020-09-24 DIAGNOSIS — Z7982 Long term (current) use of aspirin: Secondary | ICD-10-CM | POA: Diagnosis not present

## 2020-09-24 DIAGNOSIS — Z7901 Long term (current) use of anticoagulants: Secondary | ICD-10-CM | POA: Diagnosis not present

## 2020-09-24 DIAGNOSIS — E1165 Type 2 diabetes mellitus with hyperglycemia: Secondary | ICD-10-CM | POA: Diagnosis not present

## 2020-09-24 DIAGNOSIS — L03116 Cellulitis of left lower limb: Secondary | ICD-10-CM | POA: Diagnosis not present

## 2020-10-08 DIAGNOSIS — E1142 Type 2 diabetes mellitus with diabetic polyneuropathy: Secondary | ICD-10-CM | POA: Diagnosis not present

## 2020-10-08 DIAGNOSIS — B351 Tinea unguium: Secondary | ICD-10-CM | POA: Diagnosis not present

## 2020-11-18 DIAGNOSIS — I129 Hypertensive chronic kidney disease with stage 1 through stage 4 chronic kidney disease, or unspecified chronic kidney disease: Secondary | ICD-10-CM | POA: Diagnosis not present

## 2020-11-18 DIAGNOSIS — I5042 Chronic combined systolic (congestive) and diastolic (congestive) heart failure: Secondary | ICD-10-CM | POA: Diagnosis not present

## 2020-11-18 DIAGNOSIS — D638 Anemia in other chronic diseases classified elsewhere: Secondary | ICD-10-CM | POA: Diagnosis not present

## 2020-11-18 DIAGNOSIS — E1122 Type 2 diabetes mellitus with diabetic chronic kidney disease: Secondary | ICD-10-CM | POA: Diagnosis not present

## 2020-11-18 DIAGNOSIS — N189 Chronic kidney disease, unspecified: Secondary | ICD-10-CM | POA: Diagnosis not present

## 2020-11-25 DIAGNOSIS — N189 Chronic kidney disease, unspecified: Secondary | ICD-10-CM | POA: Diagnosis not present

## 2020-11-25 DIAGNOSIS — E211 Secondary hyperparathyroidism, not elsewhere classified: Secondary | ICD-10-CM | POA: Diagnosis not present

## 2020-11-25 DIAGNOSIS — I5042 Chronic combined systolic (congestive) and diastolic (congestive) heart failure: Secondary | ICD-10-CM | POA: Diagnosis not present

## 2020-11-25 DIAGNOSIS — E1122 Type 2 diabetes mellitus with diabetic chronic kidney disease: Secondary | ICD-10-CM | POA: Diagnosis not present

## 2020-11-25 DIAGNOSIS — I129 Hypertensive chronic kidney disease with stage 1 through stage 4 chronic kidney disease, or unspecified chronic kidney disease: Secondary | ICD-10-CM | POA: Diagnosis not present

## 2020-11-25 DIAGNOSIS — E559 Vitamin D deficiency, unspecified: Secondary | ICD-10-CM | POA: Diagnosis not present

## 2020-12-17 DIAGNOSIS — E1142 Type 2 diabetes mellitus with diabetic polyneuropathy: Secondary | ICD-10-CM | POA: Diagnosis not present

## 2020-12-17 DIAGNOSIS — B351 Tinea unguium: Secondary | ICD-10-CM | POA: Diagnosis not present

## 2020-12-22 DIAGNOSIS — E1165 Type 2 diabetes mellitus with hyperglycemia: Secondary | ICD-10-CM | POA: Diagnosis not present

## 2020-12-22 DIAGNOSIS — J209 Acute bronchitis, unspecified: Secondary | ICD-10-CM | POA: Diagnosis not present

## 2020-12-22 DIAGNOSIS — I10 Essential (primary) hypertension: Secondary | ICD-10-CM | POA: Diagnosis not present

## 2020-12-22 DIAGNOSIS — N189 Chronic kidney disease, unspecified: Secondary | ICD-10-CM | POA: Diagnosis not present

## 2021-01-28 DIAGNOSIS — N189 Chronic kidney disease, unspecified: Secondary | ICD-10-CM | POA: Diagnosis not present

## 2021-01-28 DIAGNOSIS — L89623 Pressure ulcer of left heel, stage 3: Secondary | ICD-10-CM | POA: Diagnosis not present

## 2021-01-28 DIAGNOSIS — E1129 Type 2 diabetes mellitus with other diabetic kidney complication: Secondary | ICD-10-CM | POA: Diagnosis not present

## 2021-01-28 DIAGNOSIS — I10 Essential (primary) hypertension: Secondary | ICD-10-CM | POA: Diagnosis not present

## 2021-01-28 DIAGNOSIS — E1122 Type 2 diabetes mellitus with diabetic chronic kidney disease: Secondary | ICD-10-CM | POA: Diagnosis not present

## 2021-01-28 DIAGNOSIS — E782 Mixed hyperlipidemia: Secondary | ICD-10-CM | POA: Diagnosis not present

## 2021-01-28 DIAGNOSIS — E1165 Type 2 diabetes mellitus with hyperglycemia: Secondary | ICD-10-CM | POA: Diagnosis not present

## 2021-01-28 DIAGNOSIS — E211 Secondary hyperparathyroidism, not elsewhere classified: Secondary | ICD-10-CM | POA: Diagnosis not present

## 2021-01-28 DIAGNOSIS — E559 Vitamin D deficiency, unspecified: Secondary | ICD-10-CM | POA: Diagnosis not present

## 2021-01-28 DIAGNOSIS — I129 Hypertensive chronic kidney disease with stage 1 through stage 4 chronic kidney disease, or unspecified chronic kidney disease: Secondary | ICD-10-CM | POA: Diagnosis not present

## 2021-01-28 DIAGNOSIS — I5042 Chronic combined systolic (congestive) and diastolic (congestive) heart failure: Secondary | ICD-10-CM | POA: Diagnosis not present

## 2021-01-28 DIAGNOSIS — E1142 Type 2 diabetes mellitus with diabetic polyneuropathy: Secondary | ICD-10-CM | POA: Diagnosis not present

## 2021-04-28 IMAGING — CT CT ABDOMEN AND PELVIS WITHOUT CONTRAST
2 of 4 series · 15 of 46 positions shown, 17 images · non-contrast
Comparison: None.

CLINICAL DATA: Abdominal pain, nausea and vomiting.

EXAM:
CT ABDOMEN AND PELVIS WITHOUT CONTRAST
TECHNIQUE: Multidetector CT imaging of the abdomen and pelvis was performed
following the standard protocol without IV contrast.

[Series 2: axial st · axial · 0.84mm/px · z∈[+932,+1312]mm · 12 of 88 slices shown, 14 images]
[im 6/88  soft-tissue]
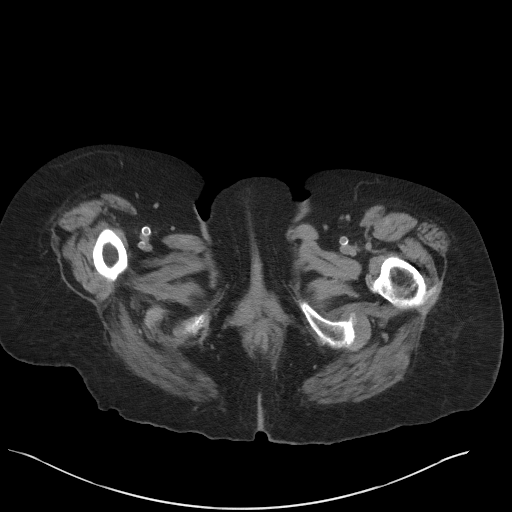
[im 6/88  bone]
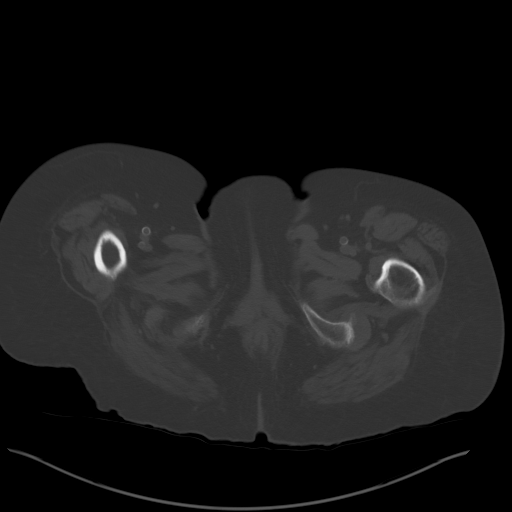
[im 16/88  soft-tissue]
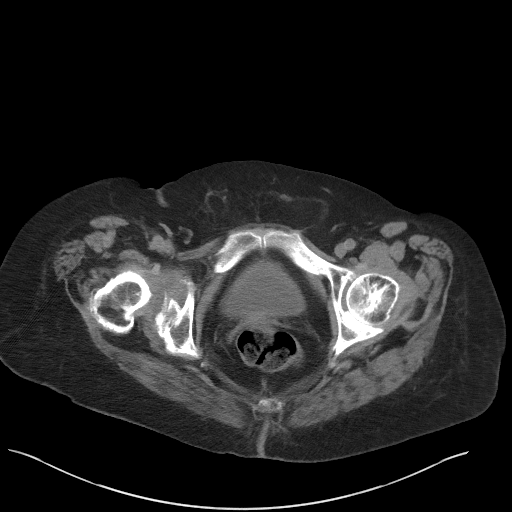
[im 21/88  soft-tissue]
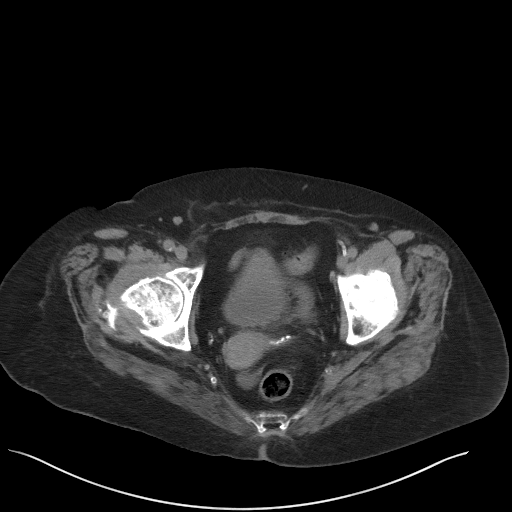
[im 26/88  soft-tissue]
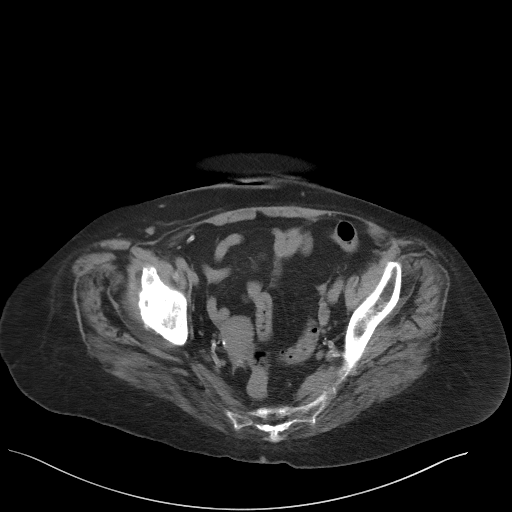
[im 36/88  soft-tissue]
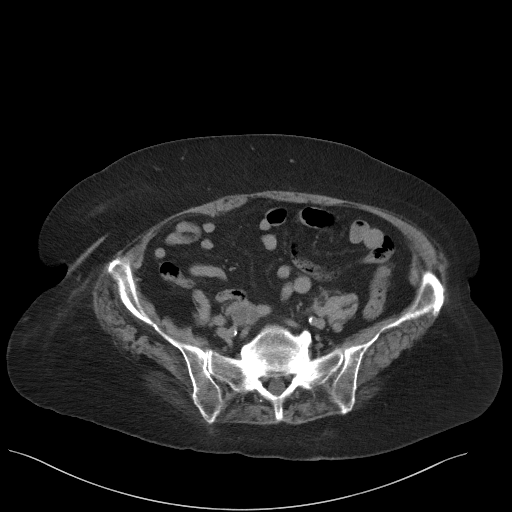
[im 41/88  soft-tissue]
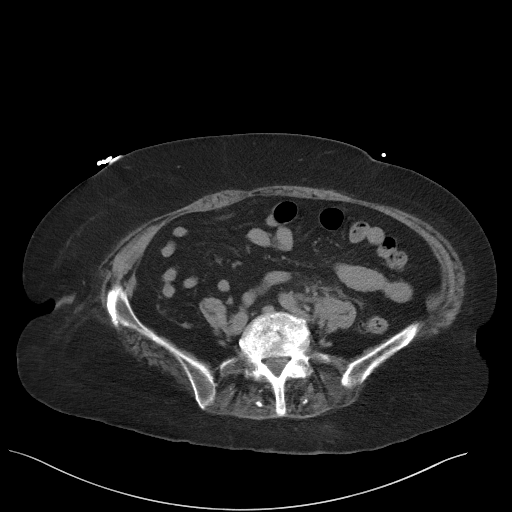
[im 47/88  soft-tissue]
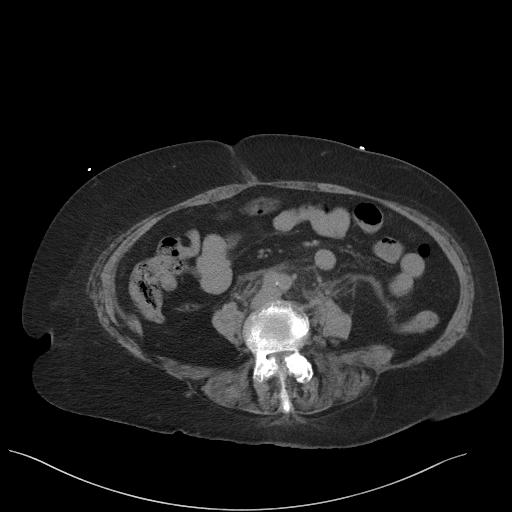
[im 57/88  soft-tissue]
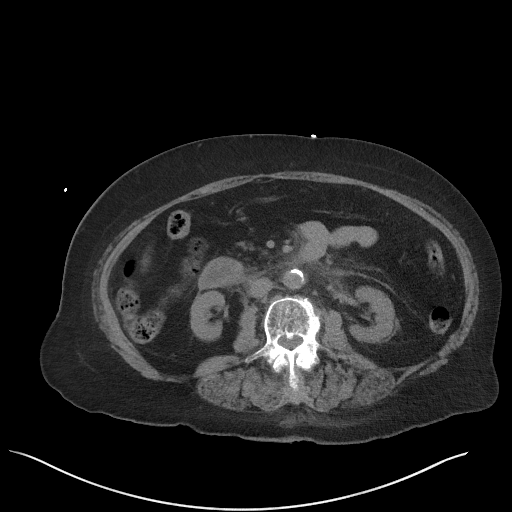
[im 62/88  soft-tissue]
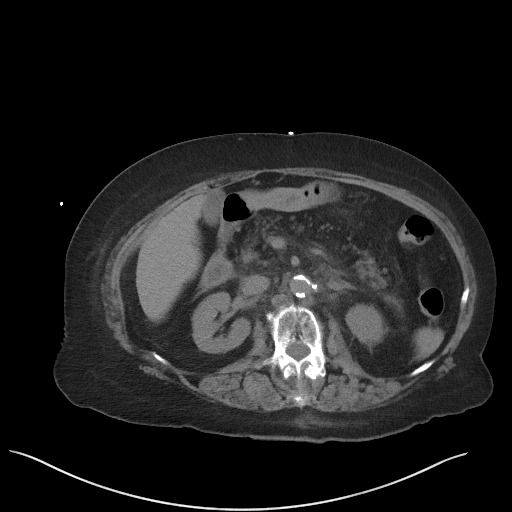
[im 62/88  bone]
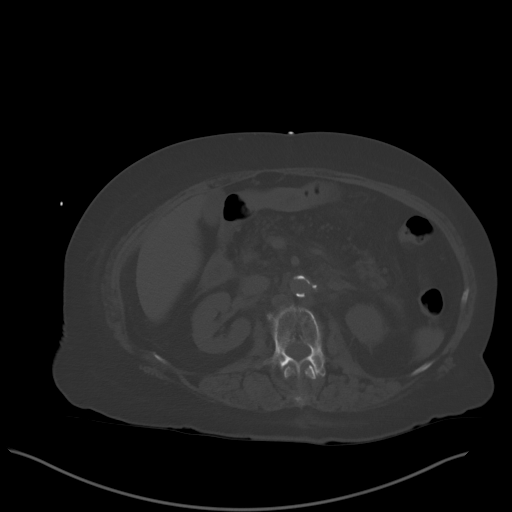
[im 67/88  soft-tissue]
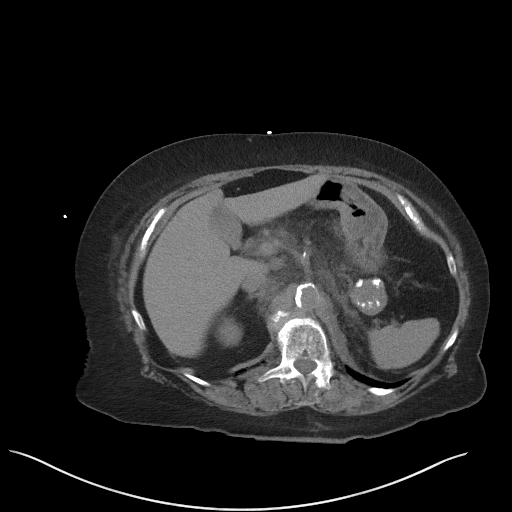
[im 77/88  soft-tissue]
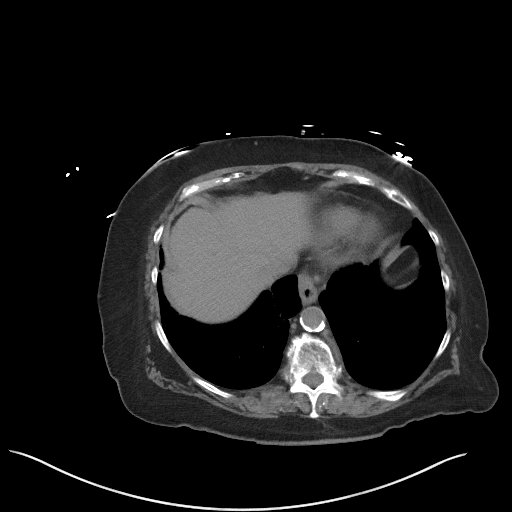
[im 82/88  soft-tissue]
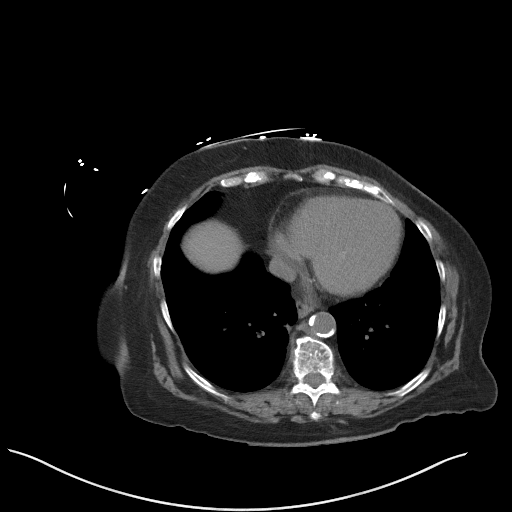

[Series 5: coronal st · coronal · 0.86mm/px · 3 of 100 slices shown]
[im 34/100  soft-tissue]
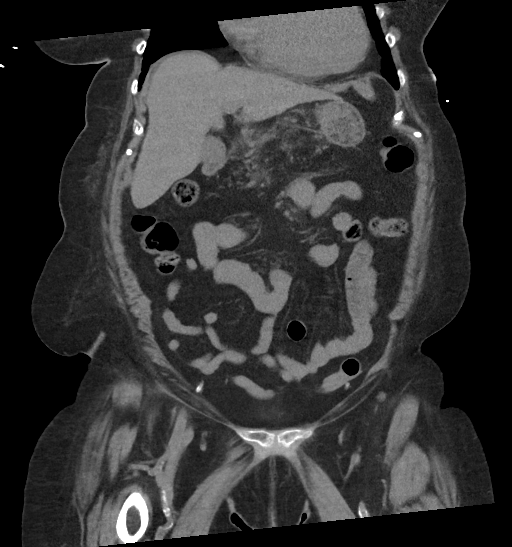
[im 45/100  soft-tissue]
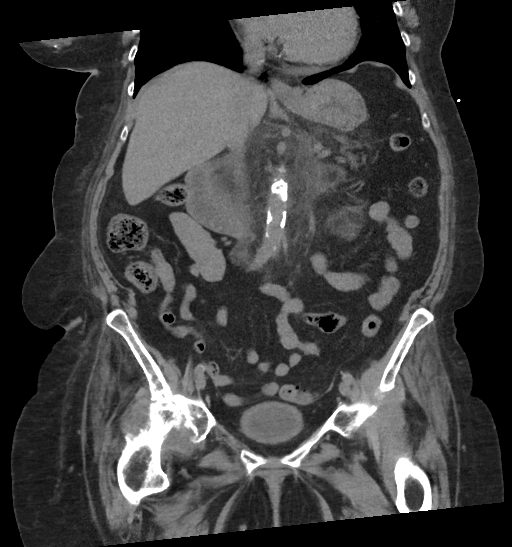
[im 56/100  soft-tissue]
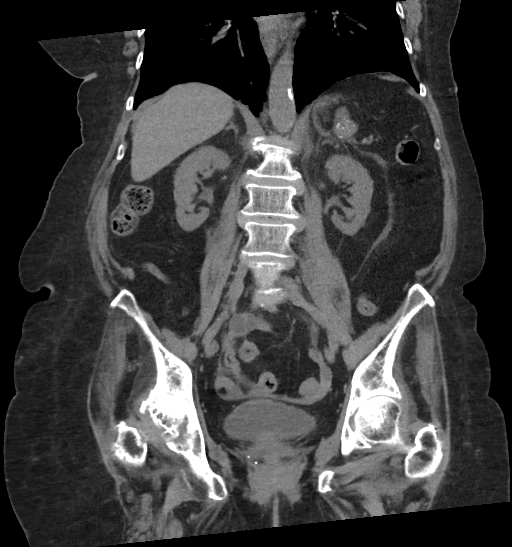

[15 of 46 positions shown; findings below may reference images not displayed]

FINDINGS: Lower chest: The lung bases are clear of an acute process. No
pleural effusions. The heart is normal in size. No pericardial
effusion. Distal esophagus is grossly normal.

Hepatobiliary: No focal hepatic lesions or intrahepatic biliary
dilatation. The gallbladder appears normal. No common bile duct
dilatation.

Pancreas: Largely fatty replaced pancreas. Fairly extensive
peripancreatic inflammatory process but the patient's lipases normal
and this is unlikely acute pancreatitis given that fact.

Spleen: Normal size.  No focal lesions.

Adrenals/Urinary Tract: The adrenal glands and kidneys are
unremarkable. No renal, ureteral or bladder calculi or obvious mass
without contrast.

Stomach/Bowel: There is a calcified lesion projecting off the
posterior aspect of the gastric fundus. This measures 2.8 cm. It
could be a calcified leiomyoma or possibly a gastric diverticulum
with chronic debris. Moderate inflammatory changes around the second
and third portions of the duodenum along with extensive interstitial
changes in the lesser sac, anterior pararenal spaces, upper
retroperitoneum and peripancreatic space. This is likely related to
duodenitis. I do not see any evidence of free air to suggest a
perforated gastric ulcer or duodenal ulcer.

The small bowel and colon are grossly normal without oral contrast.
The terminal ileum and appendix are normal. Sigmoid colon
diverticulosis without findings for acute diverticulitis.

Vascular/Lymphatic: Moderate to advanced atherosclerotic
calcifications involving the aorta and branch vessels but no
aneurysm. No mesenteric or retroperitoneal mass or adenopathy.

Reproductive: The uterus and ovaries are grossly normal. Small cyst
associated with the right ovary.

Other: Small amount of free pelvic fluid but no pelvic mass or
pelvic adenopathy. No inguinal mass or adenopathy.

Musculoskeletal: Severe degenerative changes involving both hips,
right greater than left with a moderate-sized right hip joint
effusion and probable synovitis. Degenerative lumbar spondylosis
with multilevel disc disease and facet disease.
IMPRESSION: 1. Fairly extensive epigastric inflammatory process as detailed
above. Although this could be pancreatitis the patient's lipase
level was normal making this unlikely. This is probably secondary to
duodenitis without definite ulcer or perforation.
2. 2.8 cm lesion near the gastric fundus could be a diverticulum or
partially calcified leiomyoma.
3. No renal, ureteral or bladder calculi.
4. No CT findings suspicious for acute cholecystitis.
5. Age related vascular calcifications.
6. Severe right hip joint degenerative changes and moderate right
hip joint effusion.

## 2021-04-28 IMAGING — DX CHEST - 2 VIEW
2 series · 2 of 2 positions shown · non-contrast
Comparison: Radiographs August 11, 2013.

CLINICAL DATA: Nausea, vomiting.

EXAM:
CHEST - 2 VIEW

[chest lat]
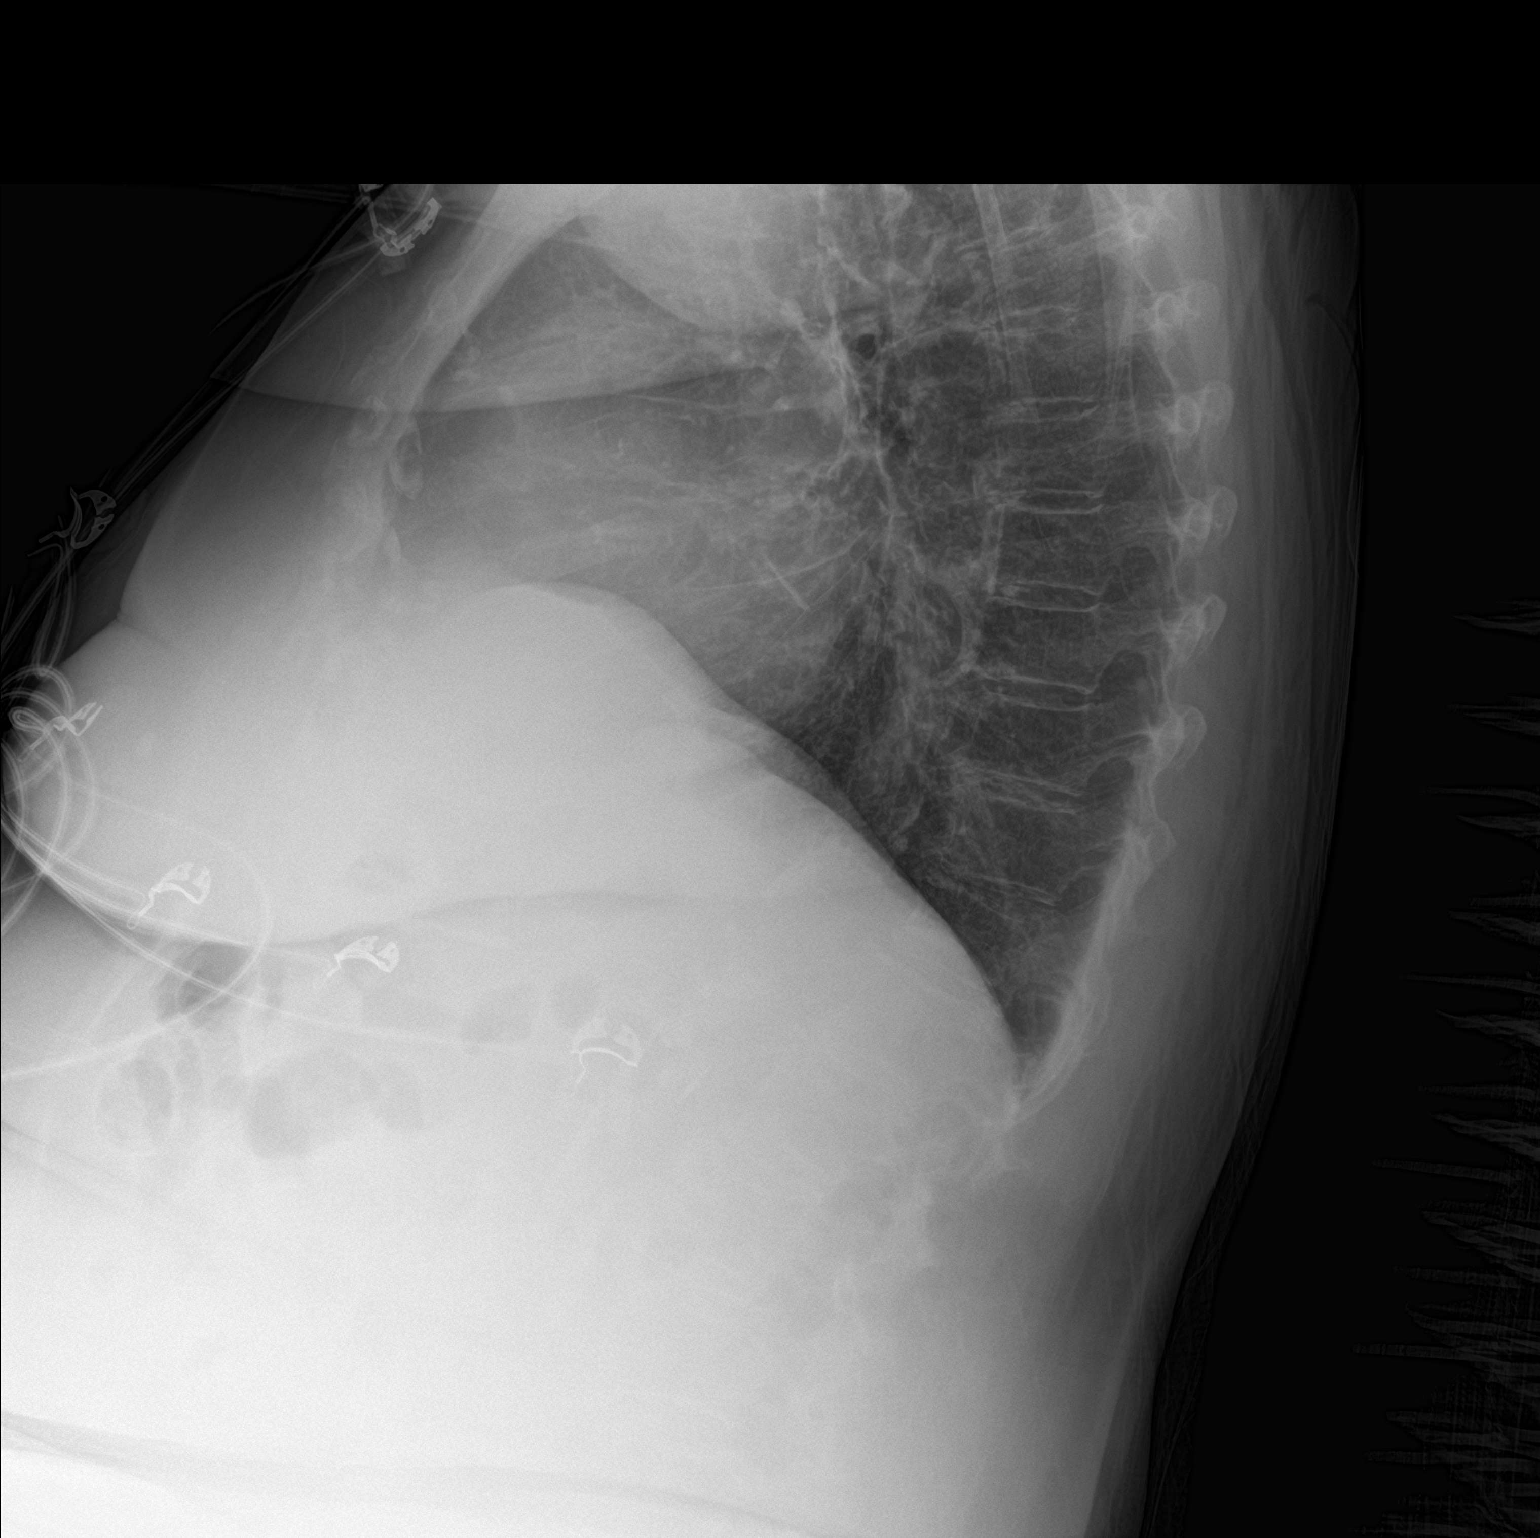

[chest ap]
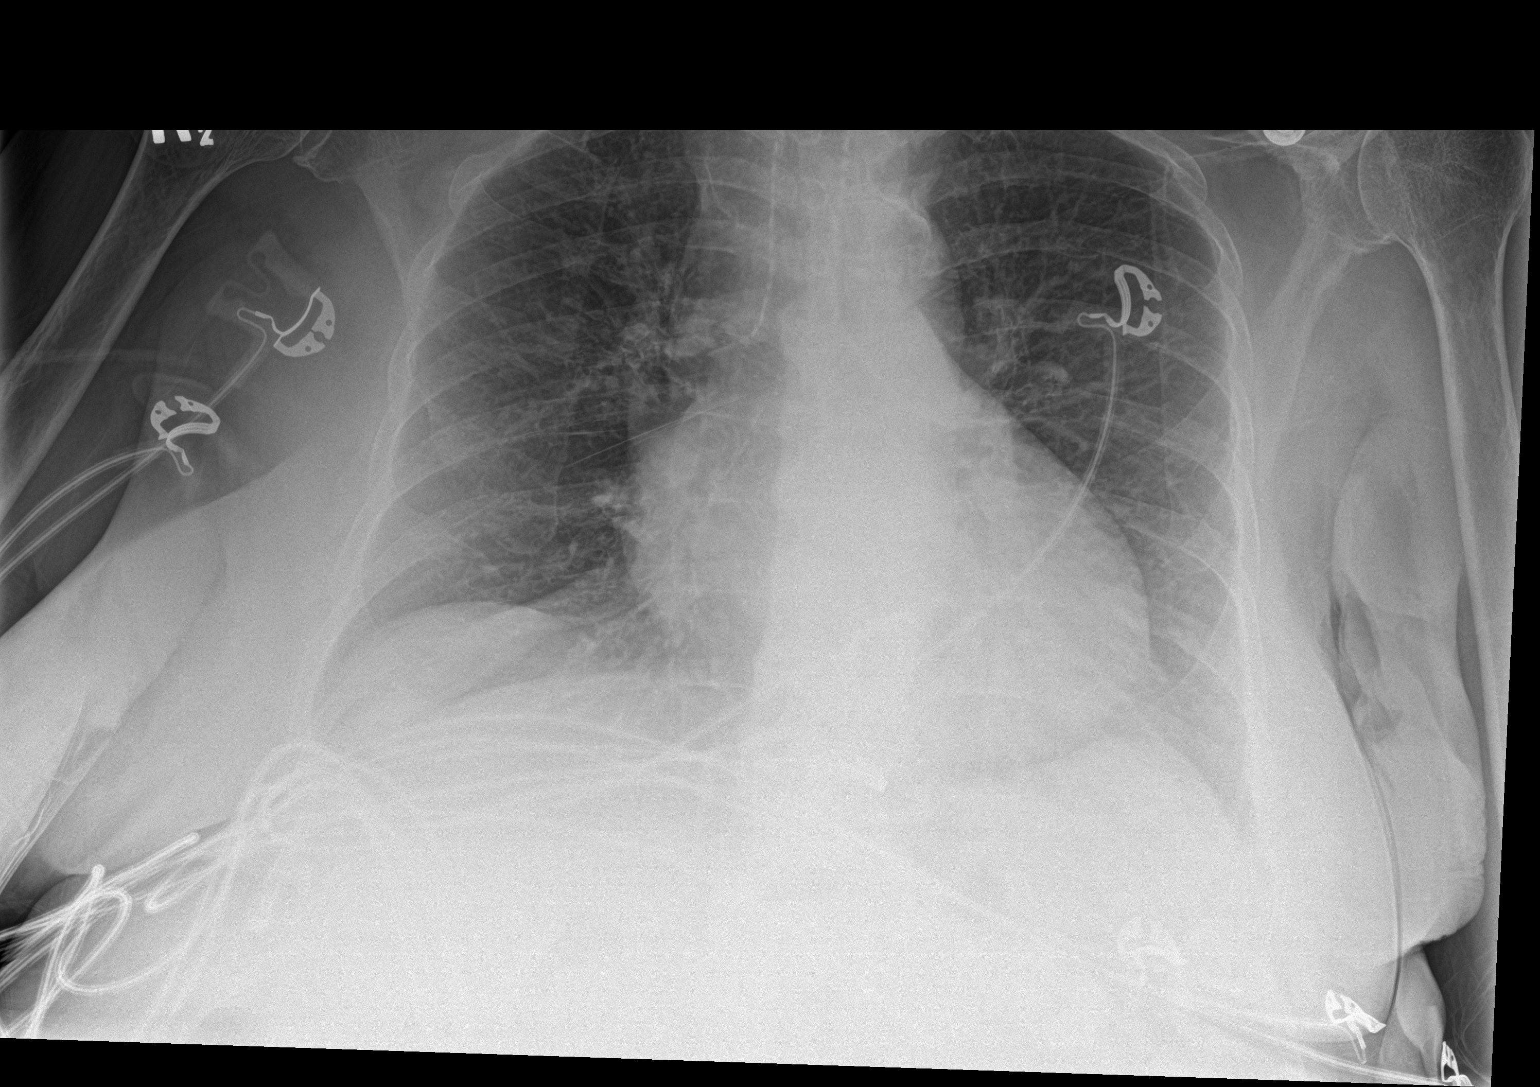

[2 of 2 positions shown; findings below may reference images not displayed]

FINDINGS: The heart size and mediastinal contours are within normal limits.
Both lungs are clear. The visualized skeletal structures are
unremarkable.
IMPRESSION: No active cardiopulmonary disease.

Aortic Atherosclerosis ([id number]-H70.0).

## 2023-03-12 ENCOUNTER — Other Ambulatory Visit (HOSPITAL_COMMUNITY): Payer: Self-pay | Admitting: Family Medicine

## 2023-03-12 DIAGNOSIS — Z1231 Encounter for screening mammogram for malignant neoplasm of breast: Secondary | ICD-10-CM
